# Patient Record
Sex: Female | Born: 1970
Health system: Southern US, Community
[De-identification: ages and names within clinical notes are randomized; demographics above are authoritative.]

## PROBLEM LIST (undated history)

## (undated) ENCOUNTER — Emergency Department (HOSPITAL_COMMUNITY): Payer: BLUE CROSS/BLUE SHIELD | Source: Home / Self Care

## (undated) DIAGNOSIS — J309 Allergic rhinitis, unspecified: Secondary | ICD-10-CM

## (undated) DIAGNOSIS — F329 Major depressive disorder, single episode, unspecified: Secondary | ICD-10-CM

## (undated) DIAGNOSIS — K219 Gastro-esophageal reflux disease without esophagitis: Secondary | ICD-10-CM

## (undated) DIAGNOSIS — F341 Dysthymic disorder: Secondary | ICD-10-CM

## (undated) DIAGNOSIS — F411 Generalized anxiety disorder: Secondary | ICD-10-CM

## (undated) DIAGNOSIS — R87619 Unspecified abnormal cytological findings in specimens from cervix uteri: Secondary | ICD-10-CM

## (undated) DIAGNOSIS — G43909 Migraine, unspecified, not intractable, without status migrainosus: Secondary | ICD-10-CM

## (undated) HISTORY — DX: Allergic rhinitis, unspecified: J30.9

## (undated) HISTORY — PX: NO PAST SURGERIES: SHX2092

## (undated) HISTORY — DX: Unspecified abnormal cytological findings in specimens from cervix uteri: R87.619

## (undated) HISTORY — DX: Dysthymic disorder: F34.1

## (undated) HISTORY — DX: Major depressive disorder, single episode, unspecified: F32.9

## (undated) HISTORY — DX: Migraine, unspecified, not intractable, without status migrainosus: G43.909

## (undated) HISTORY — PX: LEEP: SHX91

## (undated) HISTORY — PX: TUBAL LIGATION: SHX77

## (undated) HISTORY — DX: Generalized anxiety disorder: F41.1

---

## 1999-04-15 ENCOUNTER — Other Ambulatory Visit: Admission: RE | Admit: 1999-04-15 | Discharge: 1999-04-15 | Payer: Self-pay | Admitting: Obstetrics and Gynecology

## 2000-06-28 ENCOUNTER — Other Ambulatory Visit: Admission: RE | Admit: 2000-06-28 | Discharge: 2000-06-28 | Payer: Self-pay | Admitting: Obstetrics and Gynecology

## 2001-09-18 ENCOUNTER — Other Ambulatory Visit: Admission: RE | Admit: 2001-09-18 | Discharge: 2001-09-18 | Payer: Self-pay | Admitting: Obstetrics and Gynecology

## 2003-10-29 ENCOUNTER — Other Ambulatory Visit: Admission: RE | Admit: 2003-10-29 | Discharge: 2003-10-29 | Payer: Self-pay | Admitting: Obstetrics and Gynecology

## 2004-06-22 ENCOUNTER — Other Ambulatory Visit: Admission: RE | Admit: 2004-06-22 | Discharge: 2004-06-22 | Payer: Self-pay | Admitting: Obstetrics and Gynecology

## 2004-06-30 ENCOUNTER — Ambulatory Visit: Payer: Self-pay | Admitting: Licensed Clinical Social Worker

## 2004-07-05 ENCOUNTER — Ambulatory Visit: Payer: Self-pay | Admitting: Licensed Clinical Social Worker

## 2004-07-15 ENCOUNTER — Ambulatory Visit: Payer: Self-pay | Admitting: Licensed Clinical Social Worker

## 2004-07-27 ENCOUNTER — Ambulatory Visit: Payer: Self-pay | Admitting: Licensed Clinical Social Worker

## 2004-08-04 ENCOUNTER — Ambulatory Visit: Payer: Self-pay | Admitting: Licensed Clinical Social Worker

## 2004-08-10 ENCOUNTER — Ambulatory Visit: Payer: Self-pay | Admitting: Licensed Clinical Social Worker

## 2004-08-17 ENCOUNTER — Ambulatory Visit: Payer: Self-pay | Admitting: Licensed Clinical Social Worker

## 2004-08-26 ENCOUNTER — Ambulatory Visit: Payer: Self-pay | Admitting: Licensed Clinical Social Worker

## 2004-12-31 ENCOUNTER — Other Ambulatory Visit: Admission: RE | Admit: 2004-12-31 | Discharge: 2004-12-31 | Payer: Self-pay | Admitting: Obstetrics and Gynecology

## 2005-06-15 ENCOUNTER — Other Ambulatory Visit: Admission: RE | Admit: 2005-06-15 | Discharge: 2005-06-15 | Payer: Self-pay | Admitting: Obstetrics and Gynecology

## 2005-08-03 ENCOUNTER — Ambulatory Visit: Payer: Self-pay | Admitting: Internal Medicine

## 2005-11-28 ENCOUNTER — Other Ambulatory Visit: Admission: RE | Admit: 2005-11-28 | Discharge: 2005-11-28 | Payer: Self-pay | Admitting: Obstetrics and Gynecology

## 2006-10-13 ENCOUNTER — Ambulatory Visit: Payer: Self-pay | Admitting: Internal Medicine

## 2007-02-21 ENCOUNTER — Ambulatory Visit: Payer: Self-pay | Admitting: Internal Medicine

## 2007-10-25 ENCOUNTER — Encounter: Payer: Self-pay | Admitting: *Deleted

## 2007-10-25 DIAGNOSIS — G43909 Migraine, unspecified, not intractable, without status migrainosus: Secondary | ICD-10-CM | POA: Insufficient documentation

## 2007-10-25 DIAGNOSIS — J309 Allergic rhinitis, unspecified: Secondary | ICD-10-CM

## 2007-10-25 DIAGNOSIS — L509 Urticaria, unspecified: Secondary | ICD-10-CM | POA: Insufficient documentation

## 2007-10-25 DIAGNOSIS — E7800 Pure hypercholesterolemia, unspecified: Secondary | ICD-10-CM | POA: Insufficient documentation

## 2007-10-25 DIAGNOSIS — F341 Dysthymic disorder: Secondary | ICD-10-CM

## 2007-10-25 HISTORY — DX: Dysthymic disorder: F34.1

## 2007-10-25 HISTORY — DX: Migraine, unspecified, not intractable, without status migrainosus: G43.909

## 2007-10-25 HISTORY — DX: Allergic rhinitis, unspecified: J30.9

## 2008-03-26 LAB — CONVERTED CEMR LAB: Pap Smear: NORMAL

## 2009-02-26 ENCOUNTER — Ambulatory Visit: Payer: Self-pay | Admitting: Internal Medicine

## 2009-02-27 LAB — CONVERTED CEMR LAB
ALT: 10 units/L (ref 0–35)
AST: 15 units/L (ref 0–37)
Alkaline Phosphatase: 45 units/L (ref 39–117)
BUN: 11 mg/dL (ref 6–23)
Basophils Relative: 0.6 % (ref 0.0–3.0)
Bilirubin Urine: NEGATIVE
Bilirubin, Direct: 0.2 mg/dL (ref 0.0–0.3)
CO2: 28 meq/L (ref 19–32)
Calcium: 8.8 mg/dL (ref 8.4–10.5)
Eosinophils Relative: 2.4 % (ref 0.0–5.0)
Glucose, Bld: 87 mg/dL (ref 70–99)
HDL: 74.4 mg/dL (ref >39.00)
Ketones, ur: NEGATIVE mg/dL
Monocytes Relative: 7.3 % (ref 3.0–12.0)
Neutrophils Relative %: 50.2 % (ref 43.0–77.0)
Nitrite: NEGATIVE
Platelets: 143 10*3/uL — ABNORMAL LOW (ref 150.0–400.0)
Potassium: 4.5 meq/L (ref 3.5–5.1)
RBC: 4.2 M/uL (ref 3.87–5.11)
Sodium: 140 meq/L (ref 135–145)
Total Bilirubin: 1 mg/dL (ref 0.3–1.2)
Total CHOL/HDL Ratio: 3
Total Protein, Urine: NEGATIVE mg/dL
Urine Glucose: NEGATIVE mg/dL
VLDL: 5.2 mg/dL (ref 0.0–40.0)
WBC: 4 10*3/uL — ABNORMAL LOW (ref 4.5–10.5)
pH: 5.5 (ref 5.0–8.0)

## 2009-03-05 ENCOUNTER — Ambulatory Visit: Payer: Self-pay | Admitting: Internal Medicine

## 2009-10-05 ENCOUNTER — Ambulatory Visit: Payer: Self-pay | Admitting: Internal Medicine

## 2009-10-05 DIAGNOSIS — F32A Depression, unspecified: Secondary | ICD-10-CM | POA: Insufficient documentation

## 2009-10-05 DIAGNOSIS — F329 Major depressive disorder, single episode, unspecified: Secondary | ICD-10-CM

## 2009-10-05 DIAGNOSIS — F411 Generalized anxiety disorder: Secondary | ICD-10-CM

## 2009-10-05 DIAGNOSIS — F3289 Other specified depressive episodes: Secondary | ICD-10-CM

## 2009-10-05 HISTORY — DX: Generalized anxiety disorder: F41.1

## 2009-10-05 HISTORY — DX: Other specified depressive episodes: F32.89

## 2009-10-05 HISTORY — DX: Major depressive disorder, single episode, unspecified: F32.9

## 2009-10-21 ENCOUNTER — Telehealth: Payer: Self-pay | Admitting: Internal Medicine

## 2010-09-28 NOTE — Assessment & Plan Note (Signed)
Summary: ANXIETY/NWS   Vital Signs:  Patient profile:   40 year old female Height:      68 inches Weight:      138 pounds BMI:     21.06 O2 Sat:      98 % on Room air Temp:     97.1 degrees F oral Pulse rate:   63 / minute BP sitting:   110 / 70  (left arm) Cuff size:   regular  Vitals Entered ByZella Ball Ewing (October 05, 2009 2:19 PM)  O2 Flow:  Room air CC: anxiety/RE   CC:  anxiety/RE.  History of Present Illness: currently separated from father of her children, then more trouble more recently;;citalopram seemed to do quite well with another episode a few years ago;; no suicidal ideation;  aniety is severe and missed a couple of days, but wal also assoc with one month BCP htat she took for irreg period;  still single parent of 2 children;  job is stable but just not very happy with her job;  no suicidal ideation and panic attacks;  no smoatic symptoms it seems but very worrisome all the time and feels queezy; Pt denies CP, sob, doe, wheezing, orthopnea, pnd, worsening LE edema, palps, dizziness or syncope   Pt denies new neuro symptoms such as headache, facial or extremity weakness   Recent labs july 2010 essentially normal including tsh.  Problems Prior to Update: 1)  Depression  (ICD-311) 2)  Anxiety  (ICD-300.00) 3)  Preventive Health Care  (ICD-V70.0) 4)  Allergic Rhinitis  (ICD-477.9) 5)  Hx of Urticaria  (ICD-708.9) 6)  Hx of Anxiety Depression  (ICD-300.4) 7)  Migraine Headache  (ICD-346.90) 8)  Hypercholesterolemia  (ICD-272.0)  Medications Prior to Update: 1)  None  Current Medications (verified): 1)  Citalopram Hydrobromide 20 Mg Tabs (Citalopram Hydrobromide) .Marland Kitchen.. 1po Once Daily  Allergies (verified): 1)  ! Penicillin  Past History:  Past Surgical History: Last updated: 03/05/2009 Denies surgical history  Social History: Last updated: 10/05/2009 Occupation: insurance Married/separated 2 children  Risk Factors: Smoking Status: quit  (10/25/2007)  Past Medical History: ALLERGIC RHINITIS (ICD-477.9) Hx of URTICARIA (ICD-708.9) Hx of ANXIETY DEPRESSION (ICD-300.4) MIGRAINE HEADACHE (ICD-346.90) HYPERCHOLESTEROLEMIA (ICD-272.0) Anxiety Depression  Social History: Reviewed history from 03/05/2009 and no changes required. Occupation: insurance Married/separated 2 children  Review of Systems       all otherwise negative per pt -   Physical Exam  General:  alert and well-developed.   Head:  normocephalic and atraumatic.   Eyes:  vision grossly intact, pupils equal, and pupils round.   Ears:  R ear normal and L ear normal.   Nose:  no external deformity and no nasal discharge.   Mouth:  no gingival abnormalities and pharynx pink and moist.   Neck:  supple and no masses.   Lungs:  normal respiratory effort and normal breath sounds.   Heart:  normal rate and regular rhythm.   Extremities:  no edema, no erythema  Psych:  depressed affect and moderately anxious.     Impression & Recommendations:  Problem # 1:  ANXIETY (ICD-300.00)  Her updated medication list for this problem includes:    Citalopram Hydrobromide 20 Mg Tabs (Citalopram hydrobromide) .Marland Kitchen... 1po once daily treat as above, f/u any worsening signs or symptoms , declines counseling due to cost  Problem # 2:  DEPRESSION (ICD-311)  Her updated medication list for this problem includes:    Citalopram Hydrobromide 20 Mg Tabs (Citalopram hydrobromide) .Marland KitchenMarland KitchenMarland KitchenMarland Kitchen  1po once daily treat as above, f/u any worsening signs or symptoms   Complete Medication List: 1)  Citalopram Hydrobromide 20 Mg Tabs (Citalopram hydrobromide) .Marland Kitchen.. 1po once daily  Patient Instructions: 1)  Please take all new medications as prescribed - start the citalopram at HALF pill for 3 days, then whole pill after that 2)  Continue all previous medications as before this visit  3)  Please schedule a follow-up appointment in 6 months with CPX labs or sooner if  needed Prescriptions: CITALOPRAM HYDROBROMIDE 20 MG TABS (CITALOPRAM HYDROBROMIDE) 1po once daily  #90 x 3   Entered and Authorized by:   Corwin Levins MD   Signed by:   Corwin Levins MD on 10/05/2009   Method used:   Print then Give to Patient   RxID:   [id number]

## 2010-09-28 NOTE — Progress Notes (Signed)
Summary: Side Effect  Phone Note Call from Patient   Caller: Patient [phone] Summary of Call: pt called to report difficulty sleeping since starting Citalopram.  Initial call taken by: Margaret Pyle, CMA,  October 21, 2009 1:13 PM  Follow-up for Phone Call        hard to say if this is from the citalopram since this is not a common side effect;  much more common is a complaint of sleepiness;  would she want something for help with getting to sleep? Follow-up by: Corwin Levins MD,  October 21, 2009 1:25 PM  Additional Follow-up for Phone Call Additional follow up Details #1::        pt would like RX to help with sleep to [facility] on [address] Additional Follow-up by: Margaret Pyle, CMA,  October 21, 2009 1:37 PM    Additional Follow-up for Phone Call Additional follow up Details #2::    ok for zolpiden as needed -. done hardcopy to LIM side B - dahlia  Follow-up by: Corwin Levins MD,  October 21, 2009 2:50 PM  Additional Follow-up for Phone Call Additional follow up Details #3:: Details for Additional Follow-up Action Taken: RX faxed to pharmacy per pr request Additional Follow-up by: Margaret Pyle, CMA,  October 21, 2009 3:03 PM  New/Updated Medications: ZOLPIDEM TARTRATE 10 MG TABS (ZOLPIDEM TARTRATE) 1po at bedtime as needed Prescriptions: ZOLPIDEM TARTRATE 10 MG TABS (ZOLPIDEM TARTRATE) 1po at bedtime as needed  #30 x 3   Entered and Authorized by:   Corwin Levins MD   Signed by:   Corwin Levins MD on 10/21/2009   Method used:   Print then Give to Patient   RxID:   [id number]

## 2010-12-15 ENCOUNTER — Other Ambulatory Visit: Payer: Self-pay

## 2010-12-15 ENCOUNTER — Other Ambulatory Visit: Payer: Self-pay | Admitting: Internal Medicine

## 2010-12-15 DIAGNOSIS — Z0000 Encounter for general adult medical examination without abnormal findings: Secondary | ICD-10-CM

## 2010-12-17 ENCOUNTER — Other Ambulatory Visit (INDEPENDENT_AMBULATORY_CARE_PROVIDER_SITE_OTHER): Payer: BC Managed Care – PPO | Admitting: Internal Medicine

## 2010-12-17 ENCOUNTER — Other Ambulatory Visit (INDEPENDENT_AMBULATORY_CARE_PROVIDER_SITE_OTHER): Payer: BC Managed Care – PPO

## 2010-12-17 DIAGNOSIS — Z0000 Encounter for general adult medical examination without abnormal findings: Secondary | ICD-10-CM

## 2010-12-17 DIAGNOSIS — E785 Hyperlipidemia, unspecified: Secondary | ICD-10-CM

## 2010-12-17 LAB — BASIC METABOLIC PANEL
Calcium: 9 mg/dL (ref 8.4–10.5)
Chloride: 105 mEq/L (ref 96–112)
Creatinine, Ser: 0.8 mg/dL (ref 0.4–1.2)
Sodium: 140 mEq/L (ref 135–145)

## 2010-12-17 LAB — CBC WITH DIFFERENTIAL/PLATELET
Basophils Absolute: 0 10*3/uL (ref 0.0–0.1)
Eosinophils Absolute: 0.1 10*3/uL (ref 0.0–0.7)
Hemoglobin: 13.4 g/dL (ref 12.0–15.0)
Lymphocytes Relative: 32.1 % (ref 12.0–46.0)
MCHC: 34.6 g/dL (ref 30.0–36.0)
Neutro Abs: 3.4 10*3/uL (ref 1.4–7.7)
Neutrophils Relative %: 58.4 % (ref 43.0–77.0)
Platelets: 178 10*3/uL (ref 150.0–400.0)
RDW: 13.1 % (ref 11.5–14.6)

## 2010-12-17 LAB — URINALYSIS
Bilirubin Urine: NEGATIVE
Ketones, ur: NEGATIVE
Leukocytes, UA: NEGATIVE
Urobilinogen, UA: 0.2 (ref 0.0–1.0)

## 2010-12-17 LAB — LIPID PANEL
HDL: 74.3 mg/dL (ref >39.00)
Total CHOL/HDL Ratio: 3
Triglycerides: 26 mg/dL (ref 0.0–149.0)

## 2010-12-17 LAB — TSH: TSH: 1.28 u[IU]/mL (ref 0.35–5.50)

## 2010-12-17 LAB — HEPATIC FUNCTION PANEL
ALT: 14 U/L (ref 0–35)
Bilirubin, Direct: 0.1 mg/dL (ref 0.0–0.3)
Total Bilirubin: 1.1 mg/dL (ref 0.3–1.2)

## 2010-12-19 ENCOUNTER — Encounter: Payer: Self-pay | Admitting: Internal Medicine

## 2010-12-19 DIAGNOSIS — Z0000 Encounter for general adult medical examination without abnormal findings: Secondary | ICD-10-CM | POA: Insufficient documentation

## 2010-12-19 DIAGNOSIS — Z719 Counseling, unspecified: Secondary | ICD-10-CM | POA: Insufficient documentation

## 2010-12-22 ENCOUNTER — Ambulatory Visit (INDEPENDENT_AMBULATORY_CARE_PROVIDER_SITE_OTHER): Payer: BC Managed Care – PPO | Admitting: Internal Medicine

## 2010-12-22 ENCOUNTER — Encounter: Payer: Self-pay | Admitting: Internal Medicine

## 2010-12-22 ENCOUNTER — Ambulatory Visit (INDEPENDENT_AMBULATORY_CARE_PROVIDER_SITE_OTHER)
Admission: RE | Admit: 2010-12-22 | Discharge: 2010-12-22 | Disposition: A | Discharge status: Home / Self Care | Payer: BC Managed Care – PPO | Source: Ambulatory Visit | Attending: Internal Medicine | Admitting: Internal Medicine

## 2010-12-22 VITALS — BP 106/70 | HR 67 | Temp 98.4°F | Ht 68.0 in | Wt 144.1 lb

## 2010-12-22 DIAGNOSIS — F411 Generalized anxiety disorder: Secondary | ICD-10-CM

## 2010-12-22 DIAGNOSIS — R0609 Other forms of dyspnea: Secondary | ICD-10-CM

## 2010-12-22 DIAGNOSIS — Z0000 Encounter for general adult medical examination without abnormal findings: Secondary | ICD-10-CM

## 2010-12-22 DIAGNOSIS — M25519 Pain in unspecified shoulder: Secondary | ICD-10-CM

## 2010-12-22 DIAGNOSIS — M25511 Pain in right shoulder: Secondary | ICD-10-CM

## 2010-12-22 DIAGNOSIS — E7800 Pure hypercholesterolemia, unspecified: Secondary | ICD-10-CM

## 2010-12-22 MED ORDER — CITALOPRAM HYDROBROMIDE 20 MG PO TABS: 20.0000 mg | ORAL_TABLET | Freq: Every day | ORAL | Status: DC

## 2010-12-22 MED ORDER — ZOLPIDEM TARTRATE 10 MG PO TABS: 10.0000 mg | ORAL_TABLET | Freq: Every evening | ORAL | Status: DC | PRN

## 2010-12-22 NOTE — Assessment & Plan Note (Signed)
Mild , for diet control - declines dietary referral at this time

## 2010-12-22 NOTE — Assessment & Plan Note (Signed)
?   Etiology,  Not likely deconditioning, may have mild psych overlay?  Will need further evalutiaon with cxr and echo

## 2010-12-22 NOTE — Progress Notes (Signed)
Subjective:    Patient ID: [name], female    DOB: [birth year], 40 y.o.   MRN: [id number]  HPI Here for wellness and f/u;  Overall doing ok;  Pt denies CP, worsening SOB, DOE, wheezing, orthopnea, PND, worsening LE edema,  dizziness or syncope, although has had occasional few seconds palpitations intermittent since 40yo.Marland Kitchen  Pt denies neurological change such as new Headache, facial or extremity weakness.  Pt denies polydipsia, polyuria, or low sugar symptoms. Pt states overall good compliance with treatment and medications, good tolerability, and trying to follow lower cholesterol diet.  Pt denies worsening depressive symptoms, suicidal ideation or panic. No fever, wt loss, night sweats, loss of appetite, or other constitutional symptoms.  Pt states good ability with ADL's, low fall risk, home safety reviewed and adequate, no significant changes in hearing or vision, and occasionally active with exercise.   Has been active running in the past on the treadmill, up to 2-3 miles and would have right shoudler pain at end exercise, but not with ellipitcal or bike, so mostly doing bike in the past few months, also gets red in the face with running, but o/w not undue sob/doe.   Past Medical History  Diagnosis Date  . HYPERCHOLESTEROLEMIA 10/25/2007  . ANXIETY 10/05/2009  . ANXIETY DEPRESSION 10/25/2007  . DEPRESSION 10/05/2009  . MIGRAINE HEADACHE 10/25/2007  . ALLERGIC RHINITIS 10/25/2007  . URTICARIA 10/25/2007   History reviewed. No pertinent past surgical history.  reports that she has never smoked. She does not have any smokeless tobacco history on file. She reports that she drinks alcohol. She reports that she does not use illicit drugs. family history includes Hypertension in her father and unspecified family member. Allergies  Allergen Reactions  . Penicillins    Current Outpatient Prescriptions on File Prior to Visit  Medication Sig Dispense Refill  . citalopram (CELEXA) 20 MG tablet Take 20 mg  by mouth daily.        Marland Kitchen zolpidem (AMBIEN) 10 MG tablet Take 10 mg by mouth at bedtime as needed.         Review of Systems Review of Systems  Constitutional: Negative for diaphoresis, activity change, appetite change and unexpected weight change.  HENT: Negative for hearing loss, ear pain, facial swelling, mouth sores and neck stiffness.   Eyes: Negative for pain, redness and visual disturbance.  Respiratory: Negative for shortness of breath and wheezing.   Cardiovascular: Negative for chest pain and palpitations.  Gastrointestinal: Negative for diarrhea, blood in stool, abdominal distention and rectal pain.  Genitourinary: Negative for hematuria, flank pain and decreased urine volume.  Musculoskeletal: Negative for myalgias and joint swelling.  Skin: Negative for color change and wound.  Neurological: Negative for syncope and numbness.  Hematological: Negative for adenopathy.  Psychiatric/Behavioral: Negative for hallucinations, self-injury, decreased concentration and agitation.      Objective:   Physical Exam BP 106/70  Pulse 67  Temp(Src) 98.4 F (36.9 C) (Oral)  Ht 5\' 8"  (1.727 m)  Wt 144 lb 2 oz (65.375 kg)  BMI 21.91 kg/m2  SpO2 99% Physical Exam  VS noted Constitutional: Pt is oriented to person, place, and time. Appears well-developed and well-nourished.  HENT:  Head: Normocephalic and atraumatic.  Right Ear: External ear normal.  Left Ear: External ear normal.  Nose: Nose normal.  Mouth/Throat: Oropharynx is clear and moist.  Eyes: Conjunctivae and EOM are normal. Pupils are equal, round, and reactive to light.  Neck: Normal range of motion. Neck supple. No JVD  present. No tracheal deviation present.  Cardiovascular: Normal rate, regular rhythm, normal heart sounds and intact distal pulses.   Pulmonary/Chest: Effort normal and breath sounds normal.  Abdominal: Soft. Bowel sounds are normal. There is no tenderness.  Musculoskeletal: Normal range of motion. Exhibits  no edema.  Lymphadenopathy:  Has no cervical adenopathy.  Neurological: Pt is alert and oriented to person, place, and time. Pt has normal reflexes. No cranial nerve deficit.  Skin: Skin is warm and dry. No rash noted.  Psychiatric:  Has  normal mood and affect. Behavior is normal.         Assessment & Plan:

## 2010-12-22 NOTE — Patient Instructions (Addendum)
Continue all other medications as before (the citalopram was sent to the pharmacy) You could consider decreasing the citalopram to Half (10 mg) for one month, and remain at that dose, or stop after one month to see if you can do without the medication Please go to XRAY in the Basement for the x-ray test You will be contacted regarding the referral for: Echocardiogram Please call the number on the Blue Card (the [employer]Tree System) for results of testing in 2-3 days If results are normal, we can follow for any worsening symptoms, although I could refer to cardiology if you like Please return in 1 year for your yearly visit, or sooner if needed, with Lab testing done 3-5 days before

## 2010-12-22 NOTE — Assessment & Plan Note (Signed)
stable overall by hx and exam, most recent lab reviewed with pt, and pt to continue medical treatment as before 

## 2010-12-22 NOTE — Assessment & Plan Note (Signed)
Exam benign,  to f/u any worsening symptoms or concerns, suspect MSK strain with exercise/tendonitis

## 2010-12-22 NOTE — Assessment & Plan Note (Addendum)

## 2010-12-22 NOTE — Progress Notes (Signed)
Quick Note:  Voice message left on PhoneTree system - lab is negative, normal or otherwise stable, pt to continue same tx ______ 

## 2010-12-30 ENCOUNTER — Other Ambulatory Visit (HOSPITAL_COMMUNITY): Payer: BC Managed Care – PPO | Admitting: Radiology

## 2011-01-05 ENCOUNTER — Ambulatory Visit (HOSPITAL_COMMUNITY): Payer: BC Managed Care – PPO | Attending: Internal Medicine | Admitting: Radiology

## 2011-01-05 DIAGNOSIS — R0602 Shortness of breath: Secondary | ICD-10-CM

## 2011-01-05 DIAGNOSIS — R0989 Other specified symptoms and signs involving the circulatory and respiratory systems: Secondary | ICD-10-CM | POA: Insufficient documentation

## 2011-01-05 DIAGNOSIS — R0609 Other forms of dyspnea: Secondary | ICD-10-CM | POA: Insufficient documentation

## 2011-07-10 MED ORDER — ONDANSETRON HCL 4 MG/2ML IJ SOLN
INTRAMUSCULAR | Status: AC
  Filled 2011-07-10: qty 2

## 2011-07-10 MED ORDER — EPHEDRINE 5 MG/ML INJ
INTRAVENOUS | Status: AC
  Filled 2011-07-10: qty 10

## 2011-07-10 MED ORDER — MEPERIDINE HCL 25 MG/ML IJ SOLN
INTRAMUSCULAR | Status: AC
  Filled 2011-07-10: qty 1

## 2011-07-10 MED ORDER — CEFAZOLIN SODIUM 1-5 GM-% IV SOLN
INTRAVENOUS | Status: AC
  Filled 2011-07-10: qty 50

## 2011-07-10 MED ORDER — OXYTOCIN 10 UNIT/ML IJ SOLN
INTRAMUSCULAR | Status: AC
  Filled 2011-07-10: qty 4

## 2011-07-10 MED ORDER — LIDOCAINE-EPINEPHRINE (PF) 2 %-1:200000 IJ SOLN
INTRAMUSCULAR | Status: AC
  Filled 2011-07-10: qty 20

## 2011-07-10 MED ORDER — MORPHINE SULFATE 0.5 MG/ML IJ SOLN
INTRAMUSCULAR | Status: AC
  Filled 2011-07-10: qty 10

## 2011-07-10 MED ORDER — SODIUM BICARBONATE 8.4 % IV SOLN
INTRAVENOUS | Status: AC
  Filled 2011-07-10: qty 50

## 2011-07-10 MED ORDER — PHENYLEPHRINE 40 MCG/ML (10ML) SYRINGE FOR IV PUSH (FOR BLOOD PRESSURE SUPPORT)
PREFILLED_SYRINGE | INTRAVENOUS | Status: AC
  Filled 2011-07-10: qty 5

## 2011-07-18 ENCOUNTER — Encounter (HOSPITAL_COMMUNITY): Payer: Self-pay

## 2011-07-18 NOTE — Patient Instructions (Addendum)
   Your procedure is scheduled on: Friday, Nov 30th  Enter through the Main Entrance of [facility] at  12:00 noon Pick up the phone at the desk and dial [phone] and inform us of your arrival.  Please call this number if you have any problems the morning of surgery: [phone]  Remember: Do not eat food after midnight: Thursday Do not drink clear liquids after: 9:30 Friday Take these medicines the morning of surgery with a SIP OF WATER:celexa  Do not wear jewelry, make-up, or FINGER nail polish Do not wear lotions, powders, or perfumes.  You may not  wear deodorant. Do not shave 48 hours prior to surgery. Do not bring valuables to the hospital.  Patients discharged on the day of surgery will not be allowed to drive home.    Remember to use your hibiclens as instructed.Please shower with 1/2 bottle the evening before your surgery and the other 1/2 bottle the morning of surgery.

## 2011-07-22 ENCOUNTER — Encounter (HOSPITAL_COMMUNITY): Payer: Self-pay

## 2011-07-22 ENCOUNTER — Encounter (HOSPITAL_COMMUNITY)
Admission: RE | Admit: 2011-07-22 | Discharge: 2011-07-22 | Disposition: A | Discharge status: Home / Self Care | Payer: BC Managed Care – PPO | Source: Ambulatory Visit | Attending: Obstetrics and Gynecology | Admitting: Obstetrics and Gynecology

## 2011-07-22 HISTORY — DX: Gastro-esophageal reflux disease without esophagitis: K21.9

## 2011-07-22 LAB — CBC
MCH: 30.5 pg (ref 26.0–34.0)
Platelets: 179 10*3/uL (ref 150–400)
RBC: 4.23 MIL/uL (ref 3.87–5.11)
WBC: 5.1 10*3/uL (ref 4.0–10.5)

## 2011-07-22 LAB — SURGICAL PCR SCREEN: MRSA, PCR: NEGATIVE

## 2011-07-23 NOTE — H&P (Signed)
  Patient name  [name] DICTATION# [id number] CSN# [id number]  Juluis Mire, MD 07/23/2011 10:19 AM

## 2011-07-25 NOTE — H&P (Signed)
[name], SWEETING NO.:  [id number]  MEDICAL RECORD NO.:  [id number]  LOCATION:                                 FACILITY:  PHYSICIAN:  Juluis Mire, M.D.   DATE OF BIRTH:  05-Nov-1970  DATE OF ADMISSION: DATE OF DISCHARGE:                             HISTORY & PHYSICAL   Date of her surgery is July 29, 2011, at [facility].  The patient is a 40 year old, gravida 2, para 2 female, presents for removal of an IUD, hysteroscopy with D and C, and subsequent NovaSure ablation, laparoscopic bilateral tubal ligation.  In relation to present admission, the patient has been followed with significant menorrhagia.  She will have 4-5 days of flow, changing pads and tampons every 1-2 hours with clots.  She has undergone previous saline infusion ultrasounds that revealed evidence of adenomyosis.  We have tried treating this with a Mirena IUD; however, she has continued to experience abnormal bleeding and wished to proceed with more aggressive therapy.  Alternatives have been discussed including trying other hormonal measures, such as birth control pills versus Depo- Provera.  We have also discussed endometrial ablation versus hysterectomy.  The patient now presents for endometrial ablation.  Does wish to have her tubes tied, potential irreversibility of sterilization is discussed.  The failure rate of 1 in 200 was quoted.  Failures can be in the form of ectopic pregnancy requiring further surgical management. The patient expressed understanding of the alternatives and the above information.  In terms of allergies, she is allergic to PENICILLIN.  MEDICATIONS:  Celexa 10 mg daily.  PAST MEDICAL HISTORY:  Usual childhood diseases, no significant sequelae.  She has had 2 vaginal deliveries and no surgical history.  FAMILY HISTORY:  There is a history of lung cancer as well as diabetes.  SOCIAL HISTORY:  No tobacco and only minimal alcohol use.  REVIEW OF  SYSTEMS:  Noncontributory.  PHYSICAL EXAMINATION:  VITAL SIGNS:  The patient is afebrile.  Stable vital signs. HEENT:  The patient is normocephalic.  Pupils equal, round, and reactive to light and accommodation.  Extraocular movements were intact.  Sclerae and conjunctivae clear.  Oropharynx clear. NECK:  Without thyromegaly. BREASTS:  No discrete masses. LUNGS:  Clear. CARDIOVASCULAR:  Regular rhythm and rate without murmurs or gallops. ABDOMEN:  Benign.  No mass, organomegaly, or tenderness. PELVIC:  Normal external genitalia.  Vaginal mucosa is clear.  Cervix unremarkable.  Uterus normal size, shape, and contour.  Adnexa free of masses or tenderness. EXTREMITIES:  Trace edema. NEUROLOGIC:  Grossly within normal limits.  IMPRESSION: 1. Menorrhagia, secondary to adenomyosis, unresponsive to conservative     management. 2. Multiparity, desires sterility.  PLAN:  The patient will undergo removal of IUD.  We will then proceed with hysteroscopy, D and C, and NovaSure ablation.  Subsequently, we will do a laparoscopic bilateral tubal ligation.  Risks of surgery have been discussed including the risk of infection.  The risk of hemorrhage that could require transfusion with the risk of AIDS or hepatitis. Excessive bleeding could require hysterectomy.  There is a risk of injury to adjacent organs including bladder, bowel, ureters that could require  further exploratory surgery.  Risk of deep venous thrombosis and pulmonary emboli.  The patient expressed understanding of potential risks, complications, and alternatives.  We discussed success rates for ablation of about 80%, 40% amenorrheic rates and again, we discussed failure rates associated with tubal.  The patient expressed understanding of the indications, risks, and alternatives.     Juluis Mire, M.D.     JSM/MEDQ  D:  07/23/2011  T:  07/23/2011  Job:  [id number]

## 2011-07-29 ENCOUNTER — Ambulatory Visit (HOSPITAL_COMMUNITY): Payer: BC Managed Care – PPO

## 2011-07-29 ENCOUNTER — Ambulatory Visit (HOSPITAL_COMMUNITY)
Admission: RE | Admit: 2011-07-29 | Discharge: 2011-07-29 | Disposition: A | Discharge status: Home / Self Care | Payer: BC Managed Care – PPO | Source: Ambulatory Visit | Attending: Obstetrics and Gynecology | Admitting: Obstetrics and Gynecology

## 2011-07-29 ENCOUNTER — Encounter (HOSPITAL_COMMUNITY): Payer: Self-pay

## 2011-07-29 ENCOUNTER — Other Ambulatory Visit: Payer: Self-pay | Admitting: Obstetrics and Gynecology

## 2011-07-29 ENCOUNTER — Encounter (HOSPITAL_COMMUNITY)
Admission: RE | Disposition: A | Discharge status: Home / Self Care | Payer: Self-pay | Source: Ambulatory Visit | Attending: Obstetrics and Gynecology

## 2011-07-29 DIAGNOSIS — N920 Excessive and frequent menstruation with regular cycle: Secondary | ICD-10-CM | POA: Insufficient documentation

## 2011-07-29 DIAGNOSIS — N979 Female infertility, unspecified: Secondary | ICD-10-CM

## 2011-07-29 DIAGNOSIS — F3289 Other specified depressive episodes: Secondary | ICD-10-CM

## 2011-07-29 DIAGNOSIS — L509 Urticaria, unspecified: Secondary | ICD-10-CM

## 2011-07-29 DIAGNOSIS — Z302 Encounter for sterilization: Secondary | ICD-10-CM | POA: Insufficient documentation

## 2011-07-29 DIAGNOSIS — R0609 Other forms of dyspnea: Secondary | ICD-10-CM

## 2011-07-29 DIAGNOSIS — N8000 Endometriosis of the uterus, unspecified: Secondary | ICD-10-CM | POA: Insufficient documentation

## 2011-07-29 DIAGNOSIS — M25511 Pain in right shoulder: Secondary | ICD-10-CM

## 2011-07-29 DIAGNOSIS — F411 Generalized anxiety disorder: Secondary | ICD-10-CM

## 2011-07-29 DIAGNOSIS — D252 Subserosal leiomyoma of uterus: Secondary | ICD-10-CM | POA: Insufficient documentation

## 2011-07-29 DIAGNOSIS — Z0000 Encounter for general adult medical examination without abnormal findings: Secondary | ICD-10-CM

## 2011-07-29 DIAGNOSIS — F329 Major depressive disorder, single episode, unspecified: Secondary | ICD-10-CM

## 2011-07-29 DIAGNOSIS — G43909 Migraine, unspecified, not intractable, without status migrainosus: Secondary | ICD-10-CM

## 2011-07-29 DIAGNOSIS — E7800 Pure hypercholesterolemia, unspecified: Secondary | ICD-10-CM

## 2011-07-29 DIAGNOSIS — Z01812 Encounter for preprocedural laboratory examination: Secondary | ICD-10-CM | POA: Insufficient documentation

## 2011-07-29 DIAGNOSIS — J309 Allergic rhinitis, unspecified: Secondary | ICD-10-CM

## 2011-07-29 DIAGNOSIS — Z01818 Encounter for other preprocedural examination: Secondary | ICD-10-CM | POA: Insufficient documentation

## 2011-07-29 HISTORY — PX: LAPAROSCOPIC TUBAL LIGATION: SHX1937

## 2011-07-29 HISTORY — PX: IUD REMOVAL: SHX5392

## 2011-07-29 LAB — HCG, SERUM, QUALITATIVE: Preg, Serum: NEGATIVE

## 2011-07-29 SURGERY — LIGATION, FALLOPIAN TUBE, LAPAROSCOPIC
Anesthesia: General | Site: Uterus | Wound class: Clean Contaminated

## 2011-07-29 MED ORDER — LACTATED RINGERS IV SOLN
INTRAVENOUS | Status: DC
  Administered 2011-07-29 (×2): via INTRAVENOUS
  Administered 2011-07-29: 500 mL/h via INTRAVENOUS

## 2011-07-29 MED ORDER — FENTANYL CITRATE 0.05 MG/ML IJ SOLN
INTRAMUSCULAR | Status: DC | PRN
  Administered 2011-07-29 (×3): 50 ug via INTRAVENOUS
  Administered 2011-07-29: 100 ug via INTRAVENOUS

## 2011-07-29 MED ORDER — KETOROLAC TROMETHAMINE 30 MG/ML IJ SOLN
INTRAMUSCULAR | Status: DC | PRN
  Administered 2011-07-29: 30 mg via INTRAVENOUS

## 2011-07-29 MED ORDER — DEXAMETHASONE SODIUM PHOSPHATE 10 MG/ML IJ SOLN
INTRAMUSCULAR | Status: AC
  Filled 2011-07-29: qty 1

## 2011-07-29 MED ORDER — LIDOCAINE HCL (CARDIAC) 20 MG/ML IV SOLN
INTRAVENOUS | Status: DC | PRN
  Administered 2011-07-29: 20 mg via INTRAVENOUS

## 2011-07-29 MED ORDER — MIDAZOLAM HCL 5 MG/5ML IJ SOLN
INTRAMUSCULAR | Status: DC | PRN
  Administered 2011-07-29: 2 mg via INTRAVENOUS

## 2011-07-29 MED ORDER — LIDOCAINE-EPINEPHRINE (PF) 1 %-1:200000 IJ SOLN
INTRAMUSCULAR | Status: DC | PRN
  Administered 2011-07-29: 20 mL

## 2011-07-29 MED ORDER — KETOROLAC TROMETHAMINE 30 MG/ML IJ SOLN
INTRAMUSCULAR | Status: AC
  Administered 2011-07-29: 30 mg via INTRAMUSCULAR
  Filled 2011-07-29: qty 1

## 2011-07-29 MED ORDER — NEOSTIGMINE METHYLSULFATE 1 MG/ML IJ SOLN
INTRAMUSCULAR | Status: DC | PRN
  Administered 2011-07-29: 5 mg via INTRAVENOUS

## 2011-07-29 MED ORDER — FENTANYL CITRATE 0.05 MG/ML IJ SOLN: 25.0000 ug | INTRAMUSCULAR | Status: DC | PRN

## 2011-07-29 MED ORDER — ONDANSETRON HCL 4 MG/2ML IJ SOLN
INTRAMUSCULAR | Status: DC | PRN
  Administered 2011-07-29: 4 mg via INTRAVENOUS

## 2011-07-29 MED ORDER — DEXAMETHASONE SODIUM PHOSPHATE 4 MG/ML IJ SOLN
INTRAMUSCULAR | Status: DC | PRN
  Administered 2011-07-29: 10 mg via INTRAVENOUS

## 2011-07-29 MED ORDER — PROPOFOL 10 MG/ML IV EMUL
INTRAVENOUS | Status: DC | PRN
  Administered 2011-07-29: 200 mg via INTRAVENOUS

## 2011-07-29 MED ORDER — KETOROLAC TROMETHAMINE 30 MG/ML IJ SOLN
30.0000 mg | Freq: Once | INTRAMUSCULAR | Status: AC
  Administered 2011-07-29: 30 mg via INTRAMUSCULAR

## 2011-07-29 MED ORDER — PROPOFOL 10 MG/ML IV EMUL
INTRAVENOUS | Status: AC
  Filled 2011-07-29: qty 20

## 2011-07-29 MED ORDER — BUPIVACAINE HCL (PF) 0.25 % IJ SOLN
INTRAMUSCULAR | Status: DC | PRN
  Administered 2011-07-29: 4 mL

## 2011-07-29 MED ORDER — MIDAZOLAM HCL 2 MG/2ML IJ SOLN
INTRAMUSCULAR | Status: AC
  Filled 2011-07-29: qty 2

## 2011-07-29 MED ORDER — NEOSTIGMINE METHYLSULFATE 1 MG/ML IJ SOLN
INTRAMUSCULAR | Status: AC
  Filled 2011-07-29: qty 10

## 2011-07-29 MED ORDER — KETOROLAC TROMETHAMINE 30 MG/ML IJ SOLN
INTRAMUSCULAR | Status: AC
  Filled 2011-07-29: qty 1

## 2011-07-29 MED ORDER — LIDOCAINE HCL (CARDIAC) 20 MG/ML IV SOLN
INTRAVENOUS | Status: AC
  Filled 2011-07-29: qty 5

## 2011-07-29 MED ORDER — ONDANSETRON HCL 4 MG/2ML IJ SOLN
INTRAMUSCULAR | Status: AC
  Filled 2011-07-29: qty 2

## 2011-07-29 MED ORDER — GLYCOPYRROLATE 0.2 MG/ML IJ SOLN
INTRAMUSCULAR | Status: AC
  Filled 2011-07-29: qty 3

## 2011-07-29 MED ORDER — LACTATED RINGERS IV SOLN
INTRAVENOUS | Status: DC | PRN
  Administered 2011-07-29: 3000 mL via INTRAUTERINE

## 2011-07-29 MED ORDER — GLYCOPYRROLATE 0.2 MG/ML IJ SOLN
INTRAMUSCULAR | Status: DC | PRN
  Administered 2011-07-29: 1 mg via INTRAVENOUS
  Administered 2011-07-29 (×2): 0.2 mg via INTRAVENOUS

## 2011-07-29 MED ORDER — ROCURONIUM BROMIDE 100 MG/10ML IV SOLN
INTRAVENOUS | Status: DC | PRN
  Administered 2011-07-29: 40 mg via INTRAVENOUS

## 2011-07-29 MED ORDER — FENTANYL CITRATE 0.05 MG/ML IJ SOLN
INTRAMUSCULAR | Status: AC
  Filled 2011-07-29: qty 5

## 2011-07-29 MED ORDER — OXYCODONE-ACETAMINOPHEN 5-500 MG PO CAPS: 1.0000 | ORAL_CAPSULE | ORAL | Status: AC | PRN

## 2011-07-29 MED ORDER — KETOROLAC TROMETHAMINE 30 MG/ML IM SOLN: 30.0000 mg | Freq: Once | INTRAMUSCULAR | Status: DC

## 2011-07-29 MED ORDER — ROCURONIUM BROMIDE 50 MG/5ML IV SOLN
INTRAVENOUS | Status: AC
  Filled 2011-07-29: qty 1

## 2011-07-29 SURGICAL SUPPLY — 20 items
ABLATOR ENDOMETRIAL BIPOLAR (ABLATOR) ×3 IMPLANT
CATH ROBINSON RED A/P 16FR (CATHETERS) ×3 IMPLANT
CLOTH BEACON ORANGE TIMEOUT ST (SAFETY) ×3 IMPLANT
CONTAINER PREFILL 10% NBF 60ML (FORM) ×3 IMPLANT
DRSG COVADERM PLUS 2X2 (GAUZE/BANDAGES/DRESSINGS) ×3 IMPLANT
GLOVE BIO SURGEON STRL SZ7 (GLOVE) ×9 IMPLANT
GOWN PREVENTION PLUS LG XLONG (DISPOSABLE) ×6 IMPLANT
NEEDLE INSUFFLATION 14GA 120MM (NEEDLE) ×3 IMPLANT
NEEDLE SPNL 18GX3.5 QUINCKE PK (NEEDLE) ×3 IMPLANT
PACK HYSTEROSCOPY LF (CUSTOM PROCEDURE TRAY) ×3 IMPLANT
PACK LAPAROSCOPY BASIN (CUSTOM PROCEDURE TRAY) ×3 IMPLANT
STRIP CLOSURE SKIN 1/4X3 (GAUZE/BANDAGES/DRESSINGS) IMPLANT
SUT VIC AB 3-0 FS2 27 (SUTURE) ×3 IMPLANT
SUT VICRYL 0 UR6 27IN ABS (SUTURE) IMPLANT
SYR CONTROL 10ML LL (SYRINGE) ×3 IMPLANT
TOWEL OR 17X24 6PK STRL BLUE (TOWEL DISPOSABLE) ×6 IMPLANT
TROCAR BALLN 12MMX100 BLUNT (TROCAR) IMPLANT
TROCAR Z-THREAD BLADED 11X100M (TROCAR) ×3 IMPLANT
WARMER LAPAROSCOPE (MISCELLANEOUS) ×3 IMPLANT
WATER STERILE IRR 1000ML POUR (IV SOLUTION) ×3 IMPLANT

## 2011-07-29 NOTE — Progress Notes (Signed)
Patient ID: [name], female   DOB: 01/08/1971, 40 y.o.   MRN: [id number] Patient name  [name] DICTATION# [id number] CSN# [id number]  Juluis Mire, MD 07/29/2011 2:15 PM

## 2011-07-29 NOTE — Transfer of Care (Signed)
Immediate Anesthesia Transfer of Care Note  Patient: [name]  Procedure(s) Performed:  LAPAROSCOPIC TUBAL LIGATION; INTRAUTERINE DEVICE (IUD) REMOVAL; DILATATION & CURETTAGE/HYSTEROSCOPY WITH NOVASURE ABLATION  Patient Location: PACU  Anesthesia Type: General  Level of Consciousness: awake, alert  and oriented  Airway & Oxygen Therapy: Patient Spontanous Breathing and Patient connected to nasal cannula oxygen  Post-op Assessment: Report given to PACU RN and Post -op Vital signs reviewed and stable  Post vital signs: Reviewed and stable  Complications: No apparent anesthesia complications

## 2011-07-29 NOTE — H&P (Signed)
  History and physical exam unchanged 

## 2011-07-29 NOTE — Anesthesia Procedure Notes (Signed)
Procedure Name: Intubation Date/Time: 07/29/2011 1:30 PM Performed by: Lincoln Brigham Pre-anesthesia Checklist: Patient identified, Timeout performed, Emergency Drugs available, Suction available and Patient being monitored Patient Re-evaluated:Patient Re-evaluated prior to inductionOxygen Delivery Method: Circle System Utilized Preoxygenation: Pre-oxygenation with 100% oxygen Intubation Type: IV induction Ventilation: Mask ventilation without difficulty Laryngoscope Size: Miller and 2 Grade View: Grade I Tube type: Oral Airway Equipment and Method: stylet Placement Confirmation: ETT inserted through vocal cords under direct vision,  positive ETCO2 and breath sounds checked- equal and bilateral Secured at: 21 cm Tube secured with: Tape Dental Injury: Teeth and Oropharynx as per pre-operative assessment

## 2011-07-29 NOTE — Anesthesia Preprocedure Evaluation (Signed)
Anesthesia Evaluation  Patient identified by MRN, date of birth, ID band Patient awake    Reviewed: Allergy & Precautions, H&P , Patient's Chart, lab work & pertinent test results, reviewed documented beta blocker date and time   History of Anesthesia Complications Negative for: history of anesthetic complications  Airway Mallampati: II TM Distance: >3 FB Neck ROM: full    Dental No notable dental hx.    Pulmonary neg pulmonary ROS, shortness of breath,  clear to auscultation  Pulmonary exam normal       Cardiovascular Exercise Tolerance: Good neg cardio ROS regular Normal    Neuro/Psych  Headaches, PSYCHIATRIC DISORDERS Negative Neurological ROS  Negative Psych ROS   GI/Hepatic negative GI ROS, Neg liver ROS, GERD-  Controlled,  Endo/Other  Negative Endocrine ROS  Renal/GU negative Renal ROS     Musculoskeletal   Abdominal   Peds  Hematology negative hematology ROS (+)   Anesthesia Other Findings   Reproductive/Obstetrics negative OB ROS                           Anesthesia Physical Anesthesia Plan  ASA: II  Anesthesia Plan: General   Post-op Pain Management:    Induction:   Airway Management Planned:   Additional Equipment:   Intra-op Plan:   Post-operative Plan:   Informed Consent: I have reviewed the patients History and Physical, chart, labs and discussed the procedure including the risks, benefits and alternatives for the proposed anesthesia with the patient or authorized representative who has indicated his/her understanding and acceptance.   Dental Advisory Given  Plan Discussed with: CRNA and Surgeon  Anesthesia Plan Comments:         Anesthesia Quick Evaluation

## 2011-07-29 NOTE — Anesthesia Postprocedure Evaluation (Signed)
Anesthesia Post Note  Patient: [name]  Procedure(s) Performed:  LAPAROSCOPIC TUBAL LIGATION; INTRAUTERINE DEVICE (IUD) REMOVAL; DILATATION & CURETTAGE/HYSTEROSCOPY WITH NOVASURE ABLATION  Anesthesia type: GA  Patient location: PACU  Post pain: Pain level controlled  Post assessment: Post-op Vital signs reviewed  Last Vitals:  Filed Vitals:   07/29/11 1430  BP: 120/67  Pulse: 104  Temp: 36.7 C  Resp: 16    Post vital signs: Reviewed  Level of consciousness: sedated  Complications: No apparent anesthesia complications

## 2011-07-29 NOTE — Brief Op Note (Signed)
07/29/2011  2:08 PM  PATIENT:  [name]  40 y.o. female  PRE-OPERATIVE DIAGNOSIS:  menorrhagia, desires sterilization  POST-OPERATIVE DIAGNOSIS:  menorrhagia, desires sterilization  PROCEDURE:  Procedure(s): LAPAROSCOPIC TUBAL LIGATION INTRAUTERINE DEVICE (IUD) REMOVAL DILATATION & CURETTAGE/HYSTEROSCOPY WITH NOVASURE ABLATION  SURGEON:  Surgeon(s): Paul Torpey S Jolee Critcher  PHYSICIAN ASSISTANT:   ASSISTANTS: none   ANESTHESIA:   general  EBL:  Total I/O In: 1000 [I.V.:1000] Out: -   BLOOD ADMINISTERED:none  DRAINS: none   LOCAL MEDICATIONS USED:  XYLOCAINE 20 CC  Marcaine 6 cc  SPECIMEN:  Source of Specimen:  endometrial curretting  DISPOSITION OF SPECIMEN:  PATHOLOGY  COUNTS:  YES  TOURNIQUET:  * No tourniquets in log *  DICTATION: .Other Dictation: Dictation Number [id number]  PLAN OF CARE: Discharge to home after PACU  PATIENT DISPOSITION:  PACU - hemodynamically stable.   Delay start of Pharmacological VTE agent (>24hrs) due to surgical blood loss or risk of bleeding:  {YES/NO/NOT APPLICABLE:20182

## 2011-07-30 NOTE — Op Note (Signed)
NAMEJERRIYAH, [name] NO.:  [id number]  MEDICAL RECORD NO.:  [id number]  LOCATION:  [facility]                          FACILITY:  [facility]  PHYSICIAN:  Juluis Mire, M.D.   DATE OF BIRTH:  [birth year]  DATE OF PROCEDURE:  07/29/2011 DATE OF DISCHARGE:  07/29/2011                              OPERATIVE REPORT   PREOPERATIVE DIAGNOSIS:  Menometrorrhagia.  Desires sterility.  POSTOPERATIVE DIAGNOSIS:  Menometrorrhagia.  Desires sterility with addition of a subserosal fibroid.  OPERATIVE PROCEDURES:  Paracervical block.  Cervical dilation with hysteroscopy.  Endometrial curettings.  NovaSure ablation.  Laparoscopic bilateral tubal ligation.  SURGEON:  Juluis Mire, MD  ANESTHESIA:  General along with paracervical block.  ESTIMATED BLOOD LOSS:  Minimal.  PACKS AND DRAINS:  None.  INTRAOPERATIVE BLOOD PLACED:  None.  COMPLICATIONS:  None.  INDICATION:  As dictated in history and physical.  PROCEDURE:  The patient was taken to the OR and placed in supine position.  After satisfactory level of general anesthesia obtained, the patient was placed in dorsal lithotomy position using the Allen stirrups.  The abdomen, perineum, and vagina were prepped out with antiseptic solution.  The patient was then draped out for hysteroscopy. A speculum was placed in the vaginal vault.  The cervix was grasped with a single-tooth tenaculum.  A paracervical block of 1% Xylocaine with epinephrine was then instituted.  Uterus sounded to 8 cm.  Endocervical length was 3.5 cm.  It given [place] a cavity depth of 4.5 cm.  Hysteroscopy was performed.  Endometrial cavity looked clear.  No polyps or abnormalities were noted.  Endometrial curettings were obtained and sent for pathological review.  NovaSure was put in place and deployed.  We had a cavity with the 4.2 cm.  We passed the patency test.  Ablation was undertaken a power of 104 for 90 seconds.  The NovaSure was removed intact.  Repeat  hysteroscopy did reveal fairly universal ablation.  It was then at this point in time, she did have an anterior submucosal fibroid that was protruding to the cavity that was still felt okay at universal ablation.  The hysteroscope was then removed.  Total deficit of 140 mL. Hulka tenaculum was put in place.  Bladder was emptied via catheterization.  The patient's legs were repositioned.  Subumbilical incision was made with knife.  The Veress needle was then introduced into the abdominal cavity.  Abdomen was deflated approximately 3 liters of carbon dioxide. Operating laparoscope was introduced.  Visualization revealed no evidence of injury to adjacent organs.  We could see the appendix and may have been retrocecal.  The liver and tip of the gallbladder were clear.  Uterus, tubes and ovaries were unremarkable.  At midsegment, each tube was coagulated for a distance of 3 cm.  Coagulation was continued until the resistance meter read 0.  We then re-coagulated the same segment of tube.  Coagulation did extend out to the mesosalpinx. Both tubes were adequately coagulated.  We did notice some ecchymoses around the lower part of the cervix probably from the paracervical block.  This area was stable and did not expand even after deflating the abdomen and reinflating.  At this point in time, the procedure was ended.  There was no evidence of injury to adjacent organs.  The laparoscope and trocars were removed.  Subumbilical incision was closed with interrupted subcuticular of 4-0 Vicryl.  The Hulka tenaculum was then removed.  The patient was then taken out of the dorsal lithotomy position.  Once alert and extubated, transferred to the recovery in good condition.  Sponge, instrument, and needle count was correct by circulating nurse x2.     Juluis Mire, M.D.     JSM/MEDQ  D:  07/29/2011  T:  07/30/2011  Job:  [id number]

## 2011-08-01 ENCOUNTER — Encounter (HOSPITAL_COMMUNITY): Payer: Self-pay | Admitting: Obstetrics and Gynecology

## 2012-01-12 ENCOUNTER — Other Ambulatory Visit: Payer: Self-pay

## 2012-01-12 MED ORDER — ZOLPIDEM TARTRATE 10 MG PO TABS: 10.0000 mg | ORAL_TABLET | Freq: Every evening | ORAL | Status: DC | PRN

## 2012-01-12 NOTE — Telephone Encounter (Signed)
Pharmacy requesting refill on Zolpidem 10 mg, medication currently not on patients medication list

## 2012-01-12 NOTE — Telephone Encounter (Signed)
Done hardcopy to robin  

## 2012-01-13 NOTE — Telephone Encounter (Signed)
Faxed hardcopy to pharmacy. 

## 2012-03-06 ENCOUNTER — Other Ambulatory Visit: Payer: Self-pay | Admitting: Internal Medicine

## 2012-03-08 ENCOUNTER — Other Ambulatory Visit (INDEPENDENT_AMBULATORY_CARE_PROVIDER_SITE_OTHER): Payer: BC Managed Care – PPO

## 2012-03-08 ENCOUNTER — Telehealth: Payer: Self-pay

## 2012-03-08 DIAGNOSIS — Z0000 Encounter for general adult medical examination without abnormal findings: Secondary | ICD-10-CM

## 2012-03-08 LAB — URINALYSIS, ROUTINE W REFLEX MICROSCOPIC
Hgb urine dipstick: NEGATIVE
Ketones, ur: NEGATIVE
Urine Glucose: NEGATIVE
Urobilinogen, UA: 0.2 (ref 0.0–1.0)

## 2012-03-08 LAB — CBC WITH DIFFERENTIAL/PLATELET
Basophils Relative: 1 % (ref 0.0–3.0)
Eosinophils Relative: 2.6 % (ref 0.0–5.0)
HCT: 39 % (ref 36.0–46.0)
Hemoglobin: 13.2 g/dL (ref 12.0–15.0)
Lymphs Abs: 1.9 10*3/uL (ref 0.7–4.0)
Monocytes Relative: 8 % (ref 3.0–12.0)
Neutro Abs: 2.2 10*3/uL (ref 1.4–7.7)
Platelets: 183 10*3/uL (ref 150.0–400.0)
RBC: 4.37 Mil/uL (ref 3.87–5.11)
WBC: 4.6 10*3/uL (ref 4.5–10.5)

## 2012-03-08 LAB — BASIC METABOLIC PANEL
BUN: 15 mg/dL (ref 6–23)
CO2: 25 mEq/L (ref 19–32)
Calcium: 8.8 mg/dL (ref 8.4–10.5)
Glucose, Bld: 92 mg/dL (ref 70–99)
Sodium: 139 mEq/L (ref 135–145)

## 2012-03-08 LAB — LIPID PANEL
HDL: 93.6 mg/dL (ref >39.00)
VLDL: 6.2 mg/dL (ref 0.0–40.0)

## 2012-03-08 LAB — LDL CHOLESTEROL, DIRECT: Direct LDL: 101.3 mg/dL

## 2012-03-08 LAB — HEPATIC FUNCTION PANEL
Albumin: 4 g/dL (ref 3.5–5.2)
Alkaline Phosphatase: 36 U/L — ABNORMAL LOW (ref 39–117)
Total Protein: 7.1 g/dL (ref 6.0–8.3)

## 2012-03-08 NOTE — Telephone Encounter (Signed)
Put order in for physical labs. 

## 2012-03-12 ENCOUNTER — Ambulatory Visit (INDEPENDENT_AMBULATORY_CARE_PROVIDER_SITE_OTHER): Payer: BC Managed Care – PPO | Admitting: Internal Medicine

## 2012-03-12 ENCOUNTER — Encounter: Payer: Self-pay | Admitting: Internal Medicine

## 2012-03-12 VITALS — BP 110/72 | HR 61 | Temp 98.5°F | Ht 68.0 in | Wt 144.1 lb

## 2012-03-12 DIAGNOSIS — G4700 Insomnia, unspecified: Secondary | ICD-10-CM | POA: Insufficient documentation

## 2012-03-12 DIAGNOSIS — Z0000 Encounter for general adult medical examination without abnormal findings: Secondary | ICD-10-CM

## 2012-03-12 MED ORDER — CITALOPRAM HYDROBROMIDE 20 MG PO TABS: 20.0000 mg | ORAL_TABLET | Freq: Every day | ORAL | Status: DC

## 2012-03-12 NOTE — Patient Instructions (Addendum)
Continue all other medications as before Please return in 1 year for your yearly visit, or sooner if needed, with Lab testing done 3-5 days before  

## 2012-03-12 NOTE — Progress Notes (Signed)
Subjective:    Patient ID: [name], female    DOB: September 30, 1970, 41 y.o.   MRN: [id number]  HPI  Here for wellness and f/u;  Overall doing ok;  Pt denies CP, worsening SOB, DOE, wheezing, orthopnea, PND, worsening LE edema, palpitations, dizziness or syncope.  Pt denies neurological change such as new Headache, facial or extremity weakness.  Pt denies polydipsia, polyuria, or low sugar symptoms. Pt states overall good compliance with treatment and medications, good tolerability, and trying to follow lower cholesterol diet.  Pt denies worsening depressive symptoms, suicidal ideation or panic. No fever, wt loss, night sweats, loss of appetite, or other constitutional symptoms.  Pt states good ability with ADL's, low fall risk, home safety reviewed and adequate, no significant changes in hearing or vision, and occasionally active with exercise.  No acute complaints, Wants to cont the citalopram, though ambien 10 mg has some next day hangover.  Also sees dermatology on a yearly basis for exam Past Medical History  Diagnosis Date  . ANXIETY 10/05/2009  . ANXIETY DEPRESSION 10/25/2007  . DEPRESSION 10/05/2009  . MIGRAINE HEADACHE 10/25/2007  . ALLERGIC RHINITIS 10/25/2007  . GERD (gastroesophageal reflux disease)     occasional-tums prn   Past Surgical History  Procedure Date  . No past surgeries   . Laparoscopic tubal ligation 07/29/2011    Procedure: LAPAROSCOPIC TUBAL LIGATION;  Surgeon: Juluis Mire;  Location: [facility];  Service: Gynecology;  Laterality: Bilateral;  . Iud removal 07/29/2011    Procedure: INTRAUTERINE DEVICE (IUD) REMOVAL;  Surgeon: Juluis Mire;  Location: [facility];  Service: Gynecology;  Laterality: N/A;  . Tubal ligation     reports that she has never smoked. She has never used smokeless tobacco. She reports that she drinks alcohol. She reports that she does not use illicit drugs. family history includes Hypertension in her father and unspecified family member. Allergies    Allergen Reactions  . Penicillins     Childhood reaction - rash    Current Outpatient Prescriptions on File Prior to Visit  Medication Sig Dispense Refill  . DISCONTD: citalopram (CELEXA) 20 MG tablet TAKE ONE TABLET BY MOUTH ONE TIME DAILY  30 tablet  0  . zolpidem (AMBIEN) 10 MG tablet Take 1 tablet (10 mg total) by mouth at bedtime as needed for sleep.  30 tablet  2   Review of Systems Review of Systems  Constitutional: Negative for diaphoresis, activity change, appetite change and unexpected weight change.  HENT: Negative for hearing loss, ear pain, facial swelling, mouth sores and neck stiffness.   Eyes: Negative for pain, redness and visual disturbance.  Respiratory: Negative for shortness of breath and wheezing.   Cardiovascular: Negative for chest pain and palpitations.  Gastrointestinal: Negative for diarrhea, blood in stool, abdominal distention and rectal pain.  Genitourinary: Negative for hematuria, flank pain and decreased urine volume.  Musculoskeletal: Negative for myalgias and joint swelling.  Skin: Negative for color change and wound.  Neurological: Negative for syncope and numbness.  Hematological: Negative for adenopathy.  Psychiatric/Behavioral: Negative for hallucinations, self-injury, decreased concentration and agitation.      Objective:   Physical Exam BP 110/72  Pulse 61  Temp 98.5 F (36.9 C) (Oral)  Ht 5\' 8"  (1.727 m)  Wt 144 lb 2 oz (65.375 kg)  BMI 21.91 kg/m2  SpO2 97% Physical Exam  VS noted Constitutional: Pt is oriented to person, place, and time. Appears well-developed and well-nourished.  HENT:  Head: Normocephalic and atraumatic.  Right Ear: External ear normal.  Left Ear: External ear normal.  Nose: Nose normal.  Mouth/Throat: Oropharynx is clear and moist.  Eyes: Conjunctivae and EOM are normal. Pupils are equal, round, and reactive to light.  Neck: Normal range of motion. Neck supple. No JVD present. No tracheal deviation present.   Cardiovascular: Normal rate, regular rhythm, normal heart sounds and intact distal pulses.   Pulmonary/Chest: Effort normal and breath sounds normal.  Abdominal: Soft. Bowel sounds are normal. There is no tenderness.  Musculoskeletal: Normal range of motion. Exhibits no edema.  Lymphadenopathy:  Has no cervical adenopathy.  Neurological: Pt is alert and oriented to person, place, and time. Pt has normal reflexes. No cranial nerve deficit.  Skin: Skin is warm and dry. No rash noted.  Psychiatric:  Has  normal mood and affect. Behavior is normal.     Assessment & Plan:

## 2012-03-12 NOTE — Assessment & Plan Note (Signed)

## 2012-03-12 NOTE — Assessment & Plan Note (Signed)
Has some 10 mg tabs at home, ok for 1/2 qqhs prn ,  to f/u any worsening symptoms or concerns

## 2012-03-26 IMAGING — CR DG CHEST 2V
2 series · 2 of 2 positions shown · non-contrast
Comparison: None.

CLINICAL DATA: Short of breath with exertion

CHEST - 2 VIEW

[view not recorded (1 of 2)]
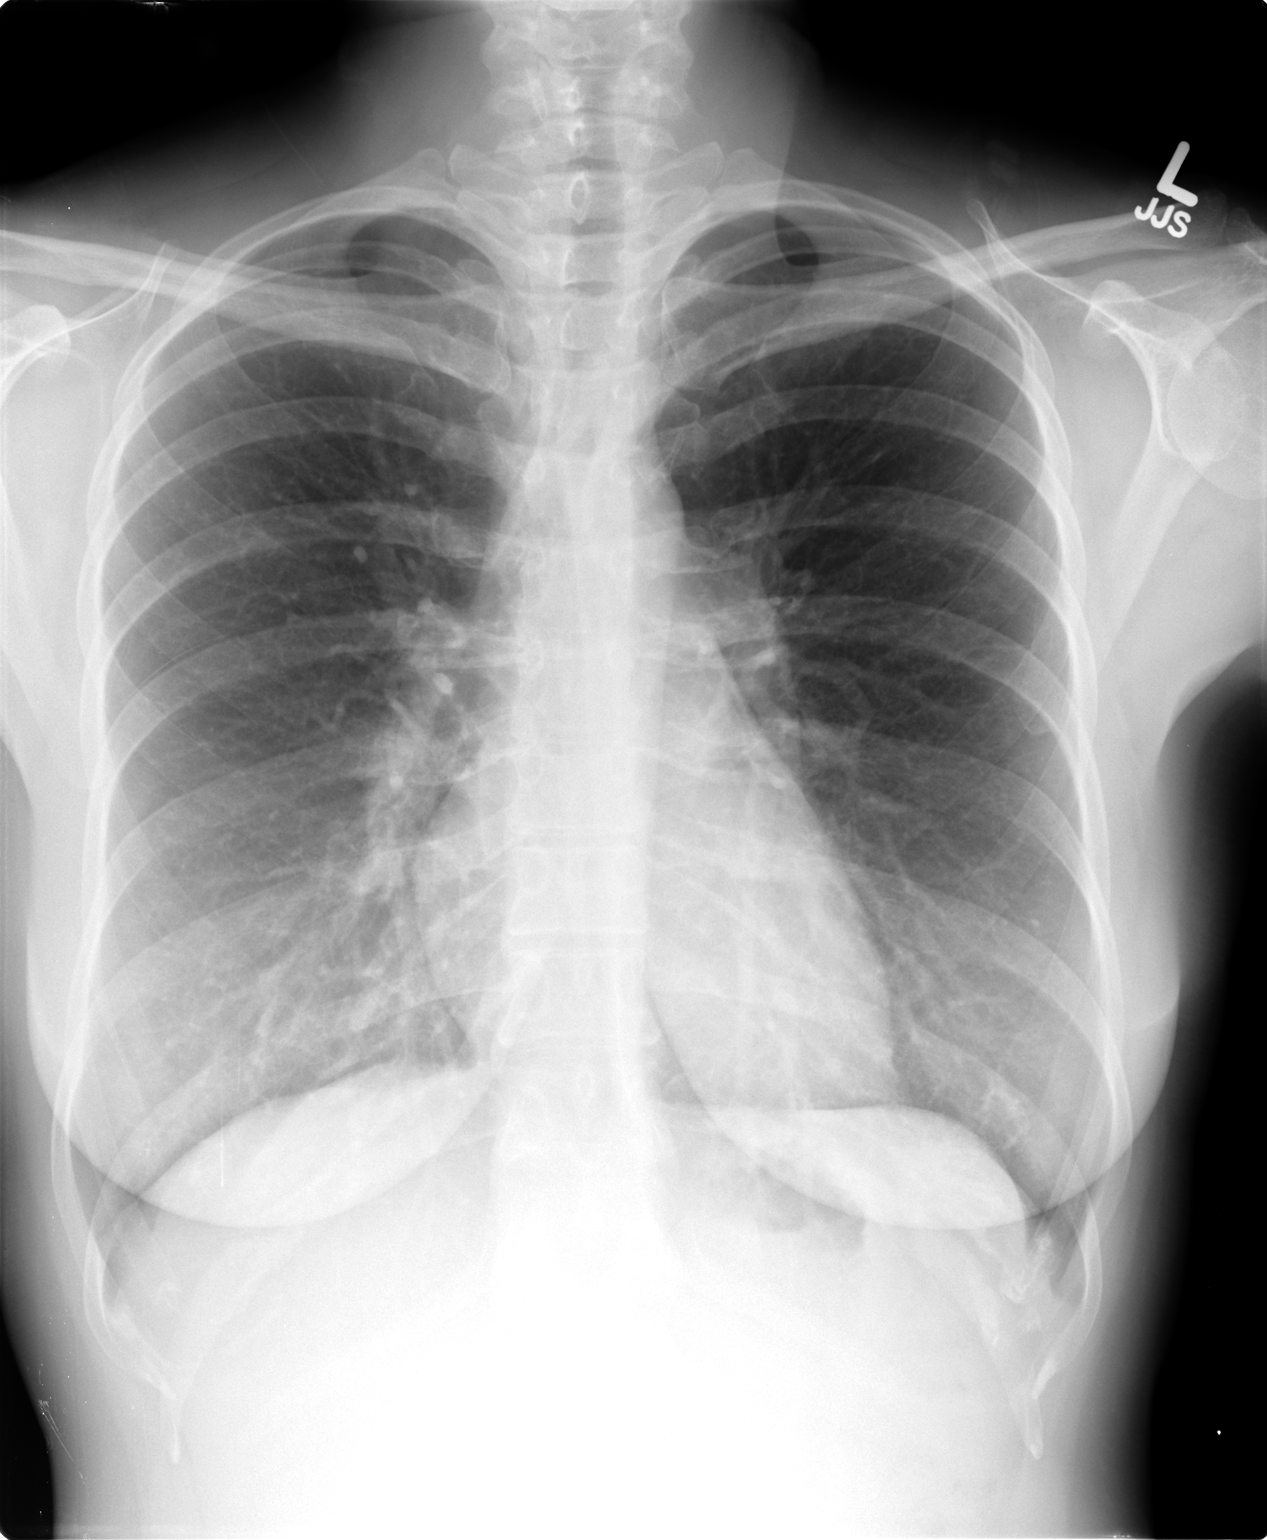

[view not recorded (2 of 2)]
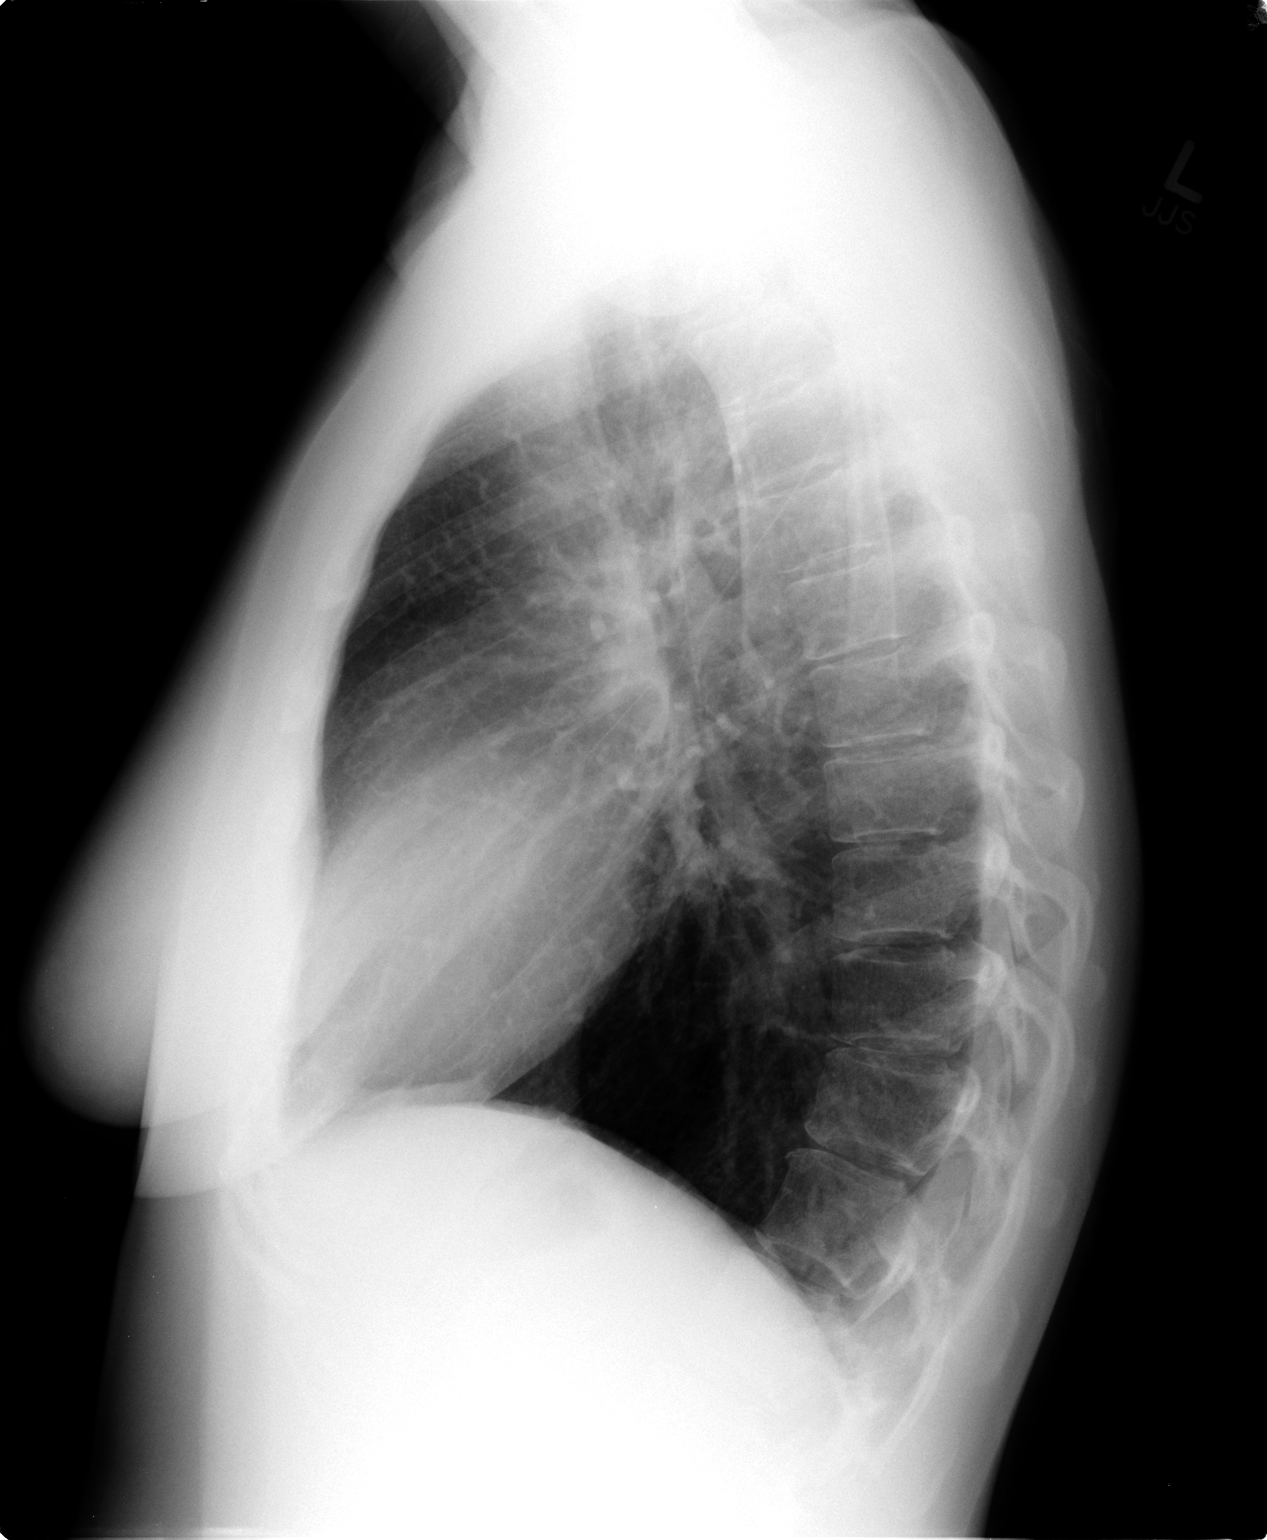

[2 of 2 positions shown; findings below may reference images not displayed]

FINDINGS: Heart and mediastinal contours normal.  Lungs clear.  No
pleural fluid.  Osseous structures and soft tissues unremarkable.
IMPRESSION: No acute or significant findings.

## 2012-04-08 ENCOUNTER — Other Ambulatory Visit: Payer: Self-pay | Admitting: Internal Medicine

## 2012-08-16 ENCOUNTER — Ambulatory Visit (INDEPENDENT_AMBULATORY_CARE_PROVIDER_SITE_OTHER): Payer: BC Managed Care – PPO | Admitting: Internal Medicine

## 2012-08-16 ENCOUNTER — Encounter: Payer: Self-pay | Admitting: Internal Medicine

## 2012-08-16 VITALS — BP 102/52 | HR 82 | Temp 99.1°F | Ht 68.0 in | Wt 144.0 lb

## 2012-08-16 DIAGNOSIS — J019 Acute sinusitis, unspecified: Secondary | ICD-10-CM

## 2012-08-16 MED ORDER — CEFUROXIME AXETIL 500 MG PO TABS: 500.0000 mg | ORAL_TABLET | Freq: Two times a day (BID) | ORAL | Status: DC

## 2012-08-16 NOTE — Patient Instructions (Addendum)

## 2012-08-16 NOTE — Progress Notes (Signed)
HPI  Pt presents to the clinic with c/o chest congestion with sputum production that started 3 weeks ago. Since that time, the chest congestion has gotten better but she has developed fatigue, sinus pain and pressure, fever and headaches. She hads been taking OTC sudafed and Mucinex without relief. She does have a history of allergies but does not take an antihistamine daily. She has had sick contacts. Nothing is making the symptoms worse, they are just lingering.  Review of Systems    Past Medical History  Diagnosis Date  . ANXIETY 10/05/2009  . ANXIETY DEPRESSION 10/25/2007  . DEPRESSION 10/05/2009  . MIGRAINE HEADACHE 10/25/2007  . ALLERGIC RHINITIS 10/25/2007  . GERD (gastroesophageal reflux disease)     occasional-tums prn    Family History  Problem Relation Age of Onset  . Hypertension      family  . Hypertension Father     History   Social History  . Marital Status: Single    Spouse Name: seperated    Number of Children: 2  . Years of Education: N/A   Occupational History  . insurance    Social History Main Topics  . Smoking status: Never Smoker   . Smokeless tobacco: Never Used  . Alcohol Use: Yes     Comment: social  . Drug Use: No  . Sexually Active: Not on file   Other Topics Concern  . Not on file   Social History Narrative  . No narrative on file    Allergies  Allergen Reactions  . Penicillins     Childhood reaction - rash      Constitutional: Positive headache, fatigue and fever. Denies abrupt weight changes.  HEENT:  Positive eye pain, pressure behind the eyes, facial pain, nasal congestion and sore throat. Denies eye redness, ear pain, ringing in the ears, wax buildup, runny nose or bloody nose. Respiratory: Positive cough and thick green sputum production. Denies difficulty breathing or shortness of breath.  Cardiovascular: Denies chest pain, chest tightness, palpitations or swelling in the hands or feet.   No other specific complaints in a  complete review of systems (except as listed in HPI above).  Objective:    General: Appears her stated age, well developed, well nourished in NAD. HEENT: Head: normal shape and size, sinuses tender to palpation; Eyes: sclera white, no icterus, conjunctiva pink, PERRLA and EOMs intact; Ears: Tm's gray and intact, normal light reflex; Nose: mucosa pink and moist, septum midline; Throat/Mouth: + PND. Teeth present, mucosa pink and moist, no exudate noted, no lesions or ulcerations noted.  Neck: Mild cervical lymphadenopathy. Neck supple, trachea midline. No massses, lumps or thyromegaly present.  Cardiovascular: Normal rate and rhythm. S1,S2 noted.  No murmur, rubs or gallops noted. No JVD or BLE edema. No carotid bruits noted. Pulmonary/Chest: Normal effort and positive vesicular breath sounds. No respiratory distress. No wheezes, rales or ronchi noted.      Assessment & Plan:   Acute bacterial sinusitis  Can use a Neti Pot which can be purchased from your local drug store. Flonase 2 sprays each nostril for 3 days and then as needed. Ceftin BID for 10 days  RTC as needed or if symptoms persist.

## 2012-09-20 ENCOUNTER — Other Ambulatory Visit: Payer: Self-pay

## 2012-09-20 MED ORDER — ZOLPIDEM TARTRATE 10 MG PO TABS: 10.0000 mg | ORAL_TABLET | Freq: Every evening | ORAL | Status: DC | PRN

## 2012-09-20 NOTE — Telephone Encounter (Signed)
Faxed hardcopy to pharmacy. 

## 2012-09-20 NOTE — Telephone Encounter (Signed)
Done hardcopy to robin  

## 2013-01-04 ENCOUNTER — Telehealth: Payer: Self-pay | Admitting: Internal Medicine

## 2013-01-04 NOTE — Telephone Encounter (Signed)
Patient Information:  Caller Name: Baleigh  Phone: [phone]  Patient: [name], [name]  Gender: Female  DOB: 07-26-1971  Age: 42 Years  PCP: Oliver Barre (Adults only)  Pregnant: No  Office Follow Up:  Does the office need to follow up with this patient?: No  Instructions For The Office: N/A   Symptoms  Reason For Call & Symptoms: Pt states she was hicking on 12/30/12 and found tick on 12/31/12, small deer tick. 24 hours after the tick removed vomting and diarrhea 01/01/13.  Reviewed Health History In EMR: Yes  Reviewed Medications In EMR: Yes  Reviewed Allergies In EMR: Yes  Reviewed Surgeries / Procedures: Yes  Date of Onset of Symptoms: 12/30/2012 OB / GYN:  LMP: 12/21/2012  Guideline(s) Used:  Tick Bite  Disposition Per Guideline:   Home Care  Reason For Disposition Reached:   Tick bite with no complications  Advice Given:  Antibiotic Ointment:  Wash the wound and your hands with soap and water after removal to prevent catching any tick disease. Apply an over-the-counter antibiotic ointment (e.g., bacitracin) to the bite once.  Expected Course:  Tick bites normally do not itch or hurt. That is why they often go unnoticed.  Call Back If:  Fever or rash occur in the next 2 weeks  Bite begins to look infected  You become worse.  Patient Will Follow Care Advice:  YES

## 2013-05-02 ENCOUNTER — Other Ambulatory Visit: Payer: Self-pay | Admitting: Internal Medicine

## 2013-10-29 ENCOUNTER — Ambulatory Visit (INDEPENDENT_AMBULATORY_CARE_PROVIDER_SITE_OTHER): Payer: BC Managed Care – PPO | Admitting: Internal Medicine

## 2013-10-29 ENCOUNTER — Encounter: Payer: Self-pay | Admitting: Internal Medicine

## 2013-10-29 ENCOUNTER — Other Ambulatory Visit (INDEPENDENT_AMBULATORY_CARE_PROVIDER_SITE_OTHER): Payer: BC Managed Care – PPO

## 2013-10-29 VITALS — BP 100/60 | HR 62 | Temp 99.3°F | Wt 141.8 lb

## 2013-10-29 DIAGNOSIS — Z0000 Encounter for general adult medical examination without abnormal findings: Secondary | ICD-10-CM

## 2013-10-29 LAB — URINALYSIS, ROUTINE W REFLEX MICROSCOPIC
BILIRUBIN URINE: NEGATIVE
Hgb urine dipstick: NEGATIVE
Ketones, ur: NEGATIVE
LEUKOCYTES UA: NEGATIVE
Nitrite: NEGATIVE
PH: 6 (ref 5.0–8.0)
Specific Gravity, Urine: >=1.03 — AB (ref 1.000–1.030)
Total Protein, Urine: NEGATIVE
UROBILINOGEN UA: 0.2 (ref 0.0–1.0)
Urine Glucose: NEGATIVE

## 2013-10-29 LAB — CBC WITH DIFFERENTIAL/PLATELET
BASOS PCT: 0.6 % (ref 0.0–3.0)
Basophils Absolute: 0 10*3/uL (ref 0.0–0.1)
EOS PCT: 1.8 % (ref 0.0–5.0)
Eosinophils Absolute: 0.1 10*3/uL (ref 0.0–0.7)
HCT: 40.6 % (ref 36.0–46.0)
Hemoglobin: 13.5 g/dL (ref 12.0–15.0)
LYMPHS PCT: 30 % (ref 12.0–46.0)
Lymphs Abs: 1.6 10*3/uL (ref 0.7–4.0)
MCHC: 33.3 g/dL (ref 30.0–36.0)
MCV: 90.3 fl (ref 78.0–100.0)
MONOS PCT: 5.6 % (ref 3.0–12.0)
Monocytes Absolute: 0.3 10*3/uL (ref 0.1–1.0)
NEUTROS PCT: 62 % (ref 43.0–77.0)
Neutro Abs: 3.4 10*3/uL (ref 1.4–7.7)
Platelets: 183 10*3/uL (ref 150.0–400.0)
RBC: 4.5 Mil/uL (ref 3.87–5.11)
RDW: 12.6 % (ref 11.5–14.6)
WBC: 5.4 10*3/uL (ref 4.5–10.5)

## 2013-10-29 LAB — BASIC METABOLIC PANEL
BUN: 18 mg/dL (ref 6–23)
CO2: 25 meq/L (ref 19–32)
Calcium: 9 mg/dL (ref 8.4–10.5)
Chloride: 107 mEq/L (ref 96–112)
Creatinine, Ser: 0.9 mg/dL (ref 0.4–1.2)
GFR: 76.79 mL/min (ref >60.00)
Glucose, Bld: 89 mg/dL (ref 70–99)
Potassium: 4.4 mEq/L (ref 3.5–5.1)
Sodium: 138 mEq/L (ref 135–145)

## 2013-10-29 LAB — LIPID PANEL
Cholesterol: 198 mg/dL (ref 0–200)
HDL: 78.9 mg/dL (ref >39.00)
LDL CALC: 115 mg/dL — AB (ref 0–99)
Total CHOL/HDL Ratio: 3
Triglycerides: 20 mg/dL (ref 0.0–149.0)
VLDL: 4 mg/dL (ref 0.0–40.0)

## 2013-10-29 LAB — HEPATIC FUNCTION PANEL
ALBUMIN: 3.9 g/dL (ref 3.5–5.2)
ALT: 9 U/L (ref 0–35)
AST: 10 U/L (ref 0–37)
Alkaline Phosphatase: 36 U/L — ABNORMAL LOW (ref 39–117)
BILIRUBIN TOTAL: 0.9 mg/dL (ref 0.3–1.2)
Bilirubin, Direct: 0.1 mg/dL (ref 0.0–0.3)
TOTAL PROTEIN: 6.9 g/dL (ref 6.0–8.3)

## 2013-10-29 LAB — TSH: TSH: 0.62 u[IU]/mL (ref 0.35–5.50)

## 2013-10-29 LAB — HIGH SENSITIVITY CRP: CRP, High Sensitivity: 0.41 mg/L (ref 0.000–5.000)

## 2013-10-29 MED ORDER — CITALOPRAM HYDROBROMIDE 20 MG PO TABS: 20.0000 mg | ORAL_TABLET | Freq: Every day | ORAL | Status: DC

## 2013-10-29 NOTE — Patient Instructions (Signed)
Please continue all other medications as before, and refills have been done if requested. Please have the pharmacy call with any other refills you may need. Please continue your efforts at being more active, low cholesterol diet, and weight control. You are otherwise up to date with prevention measures today. Please keep your appointments with your specialists as you have planned - GYN  Please go to the LAB in the Basement (turn left off the elevator) for the tests to be done today You will be contacted by phone if any changes need to be made immediately.  Otherwise, you will receive a letter about your results with an explanation, but please check with MyChart first.  Please remember to sign up for MyChart if you have not done so, as this will be important to you in the future with finding out test results, communicating by private email, and scheduling acute appointments online when needed.  Please return in 1 year for your yearly visit, or sooner if needed, with Lab testing done 3-5 days before

## 2013-10-29 NOTE — Progress Notes (Signed)
Subjective:    Patient ID: [name], female    DOB: 05-Dec-1970, 43 y.o.   MRN: [id number]  HPI   Here for wellness and f/u;  Overall doing ok;  Pt denies CP, worsening SOB, DOE, wheezing, orthopnea, PND, worsening LE edema, palpitations, dizziness or syncope.  Pt denies neurological change such as new headache, facial or extremity weakness.  Pt denies polydipsia, polyuria, or low sugar symptoms. Pt states overall good compliance with treatment and medications, good tolerability, and has been trying to follow lower cholesterol diet.  Pt denies worsening depressive symptoms, suicidal ideation or panic. No fever, night sweats, wt loss, loss of appetite, or other constitutional symptoms.  Pt states good ability with ADL's, has low fall risk, home safety reviewed and adequate, no other significant changes in hearing or vision, and occasionally active with exercise.Lost overall approx 25 lbs grad intentionally over 2-3 yrs after stress of children improved.  GYN asks/suggests to have HS - CRP.  Past Medical History  Diagnosis Date  . ANXIETY 10/05/2009  . ANXIETY DEPRESSION 10/25/2007  . DEPRESSION 10/05/2009  . MIGRAINE HEADACHE 10/25/2007  . ALLERGIC RHINITIS 10/25/2007  . GERD (gastroesophageal reflux disease)     occasional-tums prn   Past Surgical History  Procedure Laterality Date  . No past surgeries    . Laparoscopic tubal ligation  07/29/2011    Procedure: LAPAROSCOPIC TUBAL LIGATION;  Surgeon: Darlyn Chamber;  Location: [facility];  Service: Gynecology;  Laterality: Bilateral;  . Iud removal  07/29/2011    Procedure: INTRAUTERINE DEVICE (IUD) REMOVAL;  Surgeon: Darlyn Chamber;  Location: [facility];  Service: Gynecology;  Laterality: N/A;  . Tubal ligation      reports that she has never smoked. She has never used smokeless tobacco. She reports that she drinks alcohol. She reports that she does not use illicit drugs. family history includes Hypertension in her father and another family  member. Allergies  Allergen Reactions  . Penicillins     Childhood reaction - rash    Current Outpatient Prescriptions on File Prior to Visit  Medication Sig Dispense Refill  . zolpidem (AMBIEN) 10 MG tablet Take 1 tablet (10 mg total) by mouth at bedtime as needed for sleep.  30 tablet  2   No current facility-administered medications on file prior to visit.     Review of Systems Constitutional: Negative for diaphoresis, activity change, appetite change or unexpected weight change.  HENT: Negative for hearing loss, ear pain, facial swelling, mouth sores and neck stiffness.   Eyes: Negative for pain, redness and visual disturbance.  Respiratory: Negative for shortness of breath and wheezing.   Cardiovascular: Negative for chest pain and palpitations.  Gastrointestinal: Negative for diarrhea, blood in stool, abdominal distention or other pain Genitourinary: Negative for hematuria, flank pain or change in urine volume.  Musculoskeletal: Negative for myalgias and joint swelling.  Skin: Negative for color change and wound.  Neurological: Negative for syncope and numbness. other than noted Hematological: Negative for adenopathy.  Psychiatric/Behavioral: Negative for hallucinations, self-injury, decreased concentration and agitation.      Objective:   Physical Exam BP 100/60  Pulse 62  Temp(Src) 99.3 F (37.4 C) (Oral)  Wt 141 lb 12 oz (64.297 kg)  SpO2 99% VS noted,  Constitutional: Pt is oriented to person, place, and time. Appears well-developed and well-nourished.  Head: Normocephalic and atraumatic.  Right Ear: External ear normal.  Left Ear: External ear normal.  Nose: Nose normal.  Mouth/Throat: Oropharynx is  clear and moist.  Eyes: Conjunctivae and EOM are normal. Pupils are equal, round, and reactive to light.  Neck: Normal range of motion. Neck supple. No JVD present. No tracheal deviation present.  Cardiovascular: Normal rate, regular rhythm, normal heart sounds and  intact distal pulses.   Pulmonary/Chest: Effort normal and breath sounds normal.  Abdominal: Soft. Bowel sounds are normal. There is no tenderness. No HSM  Musculoskeletal: Normal range of motion. Exhibits no edema.  Lymphadenopathy:  Has no cervical adenopathy.  Neurological: Pt is alert and oriented to person, place, and time. Pt has normal reflexes. No cranial nerve deficit.  Skin: Skin is warm and dry. No rash noted.  Psychiatric:  Has  normal mood and affect. Behavior is normal.      Assessment & Plan:

## 2013-10-29 NOTE — Progress Notes (Signed)
Pre visit review using our clinic review tool, if applicable. No additional management support is needed unless otherwise documented below in the visit note. 

## 2013-10-29 NOTE — Assessment & Plan Note (Addendum)

## 2013-11-01 ENCOUNTER — Encounter: Payer: Self-pay | Admitting: Internal Medicine

## 2014-01-22 ENCOUNTER — Telehealth: Payer: Self-pay | Admitting: Internal Medicine

## 2014-01-22 MED ORDER — VALACYCLOVIR HCL 1 G PO TABS: 1000.0000 mg | ORAL_TABLET | Freq: Two times a day (BID) | ORAL | Status: DC

## 2014-01-22 NOTE — Telephone Encounter (Signed)
Patient informed. 

## 2014-01-22 NOTE — Telephone Encounter (Signed)
Done erx - valtrex

## 2014-01-22 NOTE — Telephone Encounter (Signed)
Pt called stated that she has fever blister and she would like doctor John to call in something for her at [facility]. Please call pt

## 2014-07-17 ENCOUNTER — Other Ambulatory Visit: Payer: Self-pay | Admitting: Obstetrics and Gynecology

## 2014-07-22 LAB — CYTOLOGY - PAP

## 2014-10-08 ENCOUNTER — Ambulatory Visit (INDEPENDENT_AMBULATORY_CARE_PROVIDER_SITE_OTHER): Payer: 59 | Admitting: Internal Medicine

## 2014-10-08 ENCOUNTER — Encounter: Payer: Self-pay | Admitting: Internal Medicine

## 2014-10-08 ENCOUNTER — Ambulatory Visit (INDEPENDENT_AMBULATORY_CARE_PROVIDER_SITE_OTHER)
Admission: RE | Admit: 2014-10-08 | Discharge: 2014-10-08 | Disposition: A | Discharge status: Home / Self Care | Payer: 59 | Source: Ambulatory Visit | Attending: Internal Medicine | Admitting: Internal Medicine

## 2014-10-08 VITALS — BP 114/70 | HR 61 | Temp 98.9°F | Ht 68.0 in | Wt 144.0 lb

## 2014-10-08 DIAGNOSIS — M25551 Pain in right hip: Secondary | ICD-10-CM

## 2014-10-08 DIAGNOSIS — F411 Generalized anxiety disorder: Secondary | ICD-10-CM

## 2014-10-08 DIAGNOSIS — M542 Cervicalgia: Secondary | ICD-10-CM | POA: Insufficient documentation

## 2014-10-08 MED ORDER — NAPROXEN 500 MG PO TABS: 500.0000 mg | ORAL_TABLET | Freq: Two times a day (BID) | ORAL | Status: DC | PRN

## 2014-10-08 MED ORDER — VALACYCLOVIR HCL 1 G PO TABS: 1000.0000 mg | ORAL_TABLET | Freq: Two times a day (BID) | ORAL | Status: DC

## 2014-10-08 MED ORDER — CITALOPRAM HYDROBROMIDE 10 MG PO TABS: 10.0000 mg | ORAL_TABLET | Freq: Every day | ORAL | Status: DC

## 2014-10-08 MED ORDER — CYCLOBENZAPRINE HCL 5 MG PO TABS: 5.0000 mg | ORAL_TABLET | Freq: Three times a day (TID) | ORAL | Status: DC | PRN

## 2014-10-08 MED ORDER — PREDNISONE 10 MG PO TABS: ORAL_TABLET | ORAL | Status: DC

## 2014-10-08 NOTE — Patient Instructions (Addendum)
Please take all new medication as prescribed - the antiinflammatory, prednisone, and muscle relaxer as needed  Please continue all other medications as before, and refills have been done if requested - the valtrex  OK to decrease the celexa to 10 mg per day  Please have the pharmacy call with any other refills you may need.  Please keep your appointments with your specialists as you may have planned  Please go to the XRAY Department in the Basement (go straight as you get off the elevator) for the x-ray testing  You will be contacted by phone if any changes need to be made immediately.  Otherwise, you will receive a letter about your results with an explanation, but please check with MyChart first.  Please remember to sign up for MyChart if you have not done so, as this will be important to you in the future with finding out test results, communicating by private email, and scheduling acute appointments online when needed.  Please consider seeing Dr Smith/sport medicine if your symptoms do not improve in the next week

## 2014-10-08 NOTE — Progress Notes (Signed)
Pre visit review using our clinic review tool, if applicable. No additional management support is needed unless otherwise documented below in the visit note. 

## 2014-10-08 NOTE — Progress Notes (Deleted)
   Subjective:    Patient ID: [name], female    DOB: 1970-10-09, 44 y.o.   MRN: [id number]  HPI  Here with acute on recurrent right neck pain, flared recently after originally having a pop to neck years ago with bending to plug in an appliance. Has seen chiropracter approx 2 yrs ago, told she may have problem at c3-4, ibuprofen helps but only takes very occasionally. Most recently, has now flare for 2 months right side, mild to mod, not activity limiting, has been some strecthcin exercise. Can radiate to upper back,  bowel or bladder change, fever, wt loss,  worsening LE pain/numbness/weakness, gait change or falls.  Also with 2 mo right lateral hip area   Stands ineverning 3 times per wk up to 5 hrs.      Review of Systems     Objective:   Physical Exam        Assessment & Plan:

## 2014-10-13 NOTE — Assessment & Plan Note (Signed)
Ok for decrease celexa 10 qd, but consider increase for recurrent worsening anxiety

## 2014-10-13 NOTE — Progress Notes (Signed)
Subjective:    Patient ID: [name], female    DOB: [birth year], 44 y.o.   MRN: [id number]  HPI  Here with acute on recurrent right neck pain, flared recently after originally having a pop to neck years ago with bending to plug in an appliance. Has seen chiropracter approx 2 yrs ago, told she may have problem at c3-4, ibuprofen helps but only takes very occasionally. Most recently, has now flare for 2 months right side, mild to mod, not activity limiting, has been some improved with stretching exercise. Can radiate to upper back,  bowel or bladder change, fever, wt loss,  worsening LE pain/numbness/weakness, gait change or falls.  Also with 2 mo right lateral hip area , mild to mod, ? Worse recent, worse to walk, not clear if relates to lower back, worse to lie on right side, also worse with prolonged standing -  Stands ineverning 3 times per wk up to 5 hrs.   Feeling ok with anxiety and depression recently, stable, would like to decrease the celexa on trial basis to 10 mg, no SI Past Medical History  Diagnosis Date  . ANXIETY 10/05/2009  . ANXIETY DEPRESSION 10/25/2007  . DEPRESSION 10/05/2009  . MIGRAINE HEADACHE 10/25/2007  . ALLERGIC RHINITIS 10/25/2007  . GERD (gastroesophageal reflux disease)     occasional-tums prn   Past Surgical History  Procedure Laterality Date  . No past surgeries    . Laparoscopic tubal ligation  07/29/2011    Procedure: LAPAROSCOPIC TUBAL LIGATION;  Surgeon: Darlyn Chamber;  Location: [facility];  Service: Gynecology;  Laterality: Bilateral;  . Iud removal  07/29/2011    Procedure: INTRAUTERINE DEVICE (IUD) REMOVAL;  Surgeon: Darlyn Chamber;  Location: [facility];  Service: Gynecology;  Laterality: N/A;  . Tubal ligation      reports that she has never smoked. She has never used smokeless tobacco. She reports that she drinks alcohol. She reports that she does not use illicit drugs. family history includes Hypertension in her father and another family member. Allergies    Allergen Reactions  . Penicillins     Childhood reaction - rash    Current Outpatient Prescriptions on File Prior to Visit  Medication Sig Dispense Refill  . zolpidem (AMBIEN) 10 MG tablet Take 1 tablet (10 mg total) by mouth at bedtime as needed for sleep. 30 tablet 2   No current facility-administered medications on file prior to visit.   Review of Systems  Constitutional: Negative for unusual diaphoresis or other sweats  HENT: Negative for ringing in ear Eyes: Negative for double vision or worsening visual disturbance.  Respiratory: Negative for choking and stridor.   Gastrointestinal: Negative for vomiting or other signifcant bowel change Genitourinary: Negative for hematuria or decreased urine volume.  Musculoskeletal: Negative for other MSK pain or swelling Skin: Negative for color change and worsening wound.  Neurological: Negative for tremors and numbness other than noted  Psychiatric/Behavioral: Negative for decreased concentration or agitation other than above       Objective:   Physical Exam BP 114/70 mmHg  Pulse 61  Temp(Src) 98.9 F (37.2 C) (Oral)  Ht 5' 8"  (1.727 m)  Wt 144 lb (65.318 kg)  BMI 21.90 kg/m2  LMP 10/08/2014 VS noted, non toxic Constitutional: Pt appears well-developed, well-nourished.  HENT: Head: NCAT.  Right Ear: External ear normal.  Left Ear: External ear normal.  Eyes: . Pupils are equal, round, and reactive to light. Conjunctivae and EOM are normal Neck: Normal range of  motion. Neck supple.  mild right paracervical tender, no swelling, rash, spine nontender throughout Cardiovascular: Normal rate and regular rhythm.   Pulmonary/Chest: Effort normal and breath sounds without rales or wheezing.  Abd:  Soft, NT, ND, + BS Tender also over right iliac crest area without swelling, red, rash Neurological: Pt is alert. Not confused , motor grossly intact Skin: Skin is warm. No rash Psychiatric: Pt behavior is normal. No agitation. Not  depressed affect, mild nervous    Assessment & Plan:   Subjective:    Patient ID: [name], female    DOB: 1970/12/31, 44 y.o.   MRN: [id number]  HPI Review of Systems  Objective:   Physical Exam    Assessment & Plan:

## 2014-10-13 NOTE — Assessment & Plan Note (Addendum)
C/w msk strain vs radicular pain vs bursitis, neuro no change, for pain control with nsaid prn, muscle relaxer prn,  to f/u any worsening symptoms or concerns

## 2014-10-13 NOTE — Assessment & Plan Note (Addendum)
C/w msk strain vs underlying c-spine djd or ddd, neuro no change, for pain control with nsaid prn, muscle relaxer prn and predpac asd,  to f/u any worsening symptoms or concerns, for film today to assess for DJD

## 2014-10-14 ENCOUNTER — Telehealth: Payer: Self-pay | Admitting: *Deleted

## 2014-10-14 NOTE — Telephone Encounter (Signed)
Pt states since she started taking the prednisone she has been feeling light headed & dizzy. Sleeping a lot and even depress. She has only took 4 days of meds. Can't take the cyclobenzaprine doesn't l;ike the way it make her feel. Wanting to know is this a normal side effect from steroid & how long does it takes to get out of her system...[name]

## 2014-10-15 NOTE — Telephone Encounter (Signed)
Prednisone normally wears off within a day or two.  I can change the muscle relaxer zanaflex if she would want to try a different one. thanks

## 2014-10-15 NOTE — Telephone Encounter (Signed)
Called pt no answer LMOM with md response.../lmb 

## 2014-12-25 ENCOUNTER — Encounter: Payer: Self-pay | Admitting: Internal Medicine

## 2014-12-25 DIAGNOSIS — M542 Cervicalgia: Secondary | ICD-10-CM

## 2015-01-28 ENCOUNTER — Encounter: Payer: Self-pay | Admitting: Internal Medicine

## 2015-01-29 ENCOUNTER — Other Ambulatory Visit: Payer: Self-pay

## 2015-01-29 MED ORDER — ZOLPIDEM TARTRATE 10 MG PO TABS: 10.0000 mg | ORAL_TABLET | Freq: Every evening | ORAL | Status: DC | PRN

## 2015-01-29 NOTE — Telephone Encounter (Signed)
Rx faxed to pharmacy  

## 2015-05-22 ENCOUNTER — Telehealth: Payer: Self-pay | Admitting: Internal Medicine

## 2015-05-22 MED ORDER — ZOLPIDEM TARTRATE 10 MG PO TABS: 10.0000 mg | ORAL_TABLET | Freq: Every evening | ORAL | Status: DC | PRN

## 2015-05-22 NOTE — Addendum Note (Signed)
Addended by: Biagio Borg on: 05/22/2015 12:57 PM   Modules accepted: Orders

## 2015-05-22 NOTE — Telephone Encounter (Signed)
Done hardcopy to Dahlia  

## 2015-10-14 ENCOUNTER — Other Ambulatory Visit (INDEPENDENT_AMBULATORY_CARE_PROVIDER_SITE_OTHER): Payer: BLUE CROSS/BLUE SHIELD

## 2015-10-14 ENCOUNTER — Encounter: Payer: Self-pay | Admitting: Internal Medicine

## 2015-10-14 ENCOUNTER — Ambulatory Visit (INDEPENDENT_AMBULATORY_CARE_PROVIDER_SITE_OTHER): Payer: BLUE CROSS/BLUE SHIELD | Admitting: Internal Medicine

## 2015-10-14 VITALS — BP 124/76 | HR 65 | Temp 98.4°F | Resp 20 | Ht 68.0 in | Wt 137.0 lb

## 2015-10-14 DIAGNOSIS — Z0000 Encounter for general adult medical examination without abnormal findings: Secondary | ICD-10-CM

## 2015-10-14 LAB — BASIC METABOLIC PANEL
BUN: 17 mg/dL (ref 6–23)
CHLORIDE: 107 meq/L (ref 96–112)
CO2: 26 meq/L (ref 19–32)
CREATININE: 0.78 mg/dL (ref 0.40–1.20)
Calcium: 9.2 mg/dL (ref 8.4–10.5)
GFR: 85.17 mL/min (ref >60.00)
Glucose, Bld: 101 mg/dL — ABNORMAL HIGH (ref 70–99)
Potassium: 4.5 mEq/L (ref 3.5–5.1)
Sodium: 140 mEq/L (ref 135–145)

## 2015-10-14 LAB — CBC WITH DIFFERENTIAL/PLATELET
BASOS ABS: 0.1 10*3/uL (ref 0.0–0.1)
Basophils Relative: 0.9 % (ref 0.0–3.0)
EOS ABS: 0.4 10*3/uL (ref 0.0–0.7)
Eosinophils Relative: 6.1 % — ABNORMAL HIGH (ref 0.0–5.0)
HEMATOCRIT: 41.5 % (ref 36.0–46.0)
HEMOGLOBIN: 14.3 g/dL (ref 12.0–15.0)
LYMPHS PCT: 27.6 % (ref 12.0–46.0)
Lymphs Abs: 1.7 10*3/uL (ref 0.7–4.0)
MCHC: 34.4 g/dL (ref 30.0–36.0)
MCV: 88.2 fl (ref 78.0–100.0)
Monocytes Absolute: 0.4 10*3/uL (ref 0.1–1.0)
Monocytes Relative: 6.7 % (ref 3.0–12.0)
Neutro Abs: 3.6 10*3/uL (ref 1.4–7.7)
Neutrophils Relative %: 58.7 % (ref 43.0–77.0)
Platelets: 205 10*3/uL (ref 150.0–400.0)
RBC: 4.71 Mil/uL (ref 3.87–5.11)
RDW: 12.5 % (ref 11.5–15.5)
WBC: 6.1 10*3/uL (ref 4.0–10.5)

## 2015-10-14 LAB — LIPID PANEL
CHOL/HDL RATIO: 3
Cholesterol: 213 mg/dL — ABNORMAL HIGH (ref 0–200)
HDL: 78.1 mg/dL (ref >39.00)
LDL Cholesterol: 126 mg/dL — ABNORMAL HIGH (ref 0–99)
NonHDL: 135.09
TRIGLYCERIDES: 47 mg/dL (ref 0.0–149.0)
VLDL: 9.4 mg/dL (ref 0.0–40.0)

## 2015-10-14 LAB — HEPATIC FUNCTION PANEL
ALT: 10 U/L (ref 0–35)
AST: 14 U/L (ref 0–37)
Albumin: 4.5 g/dL (ref 3.5–5.2)
Alkaline Phosphatase: 39 U/L (ref 39–117)
BILIRUBIN TOTAL: 0.7 mg/dL (ref 0.2–1.2)
Bilirubin, Direct: 0.1 mg/dL (ref 0.0–0.3)
TOTAL PROTEIN: 7.3 g/dL (ref 6.0–8.3)

## 2015-10-14 LAB — URINALYSIS, ROUTINE W REFLEX MICROSCOPIC
Hgb urine dipstick: NEGATIVE
KETONES UR: NEGATIVE
Leukocytes, UA: NEGATIVE
NITRITE: NEGATIVE
PH: 5.5 (ref 5.0–8.0)
RBC / HPF: NONE SEEN (ref 0–?)
Specific Gravity, Urine: >=1.03 — AB (ref 1.000–1.030)
TOTAL PROTEIN, URINE-UPE24: NEGATIVE
UROBILINOGEN UA: 0.2 (ref 0.0–1.0)
Urine Glucose: NEGATIVE

## 2015-10-14 LAB — TSH: TSH: 1.4 u[IU]/mL (ref 0.35–4.50)

## 2015-10-14 NOTE — Progress Notes (Signed)
Subjective:    Patient ID: [name], female    DOB: [birth year], 45 y.o.   MRN: [id number]  HPI  Here for wellness and f/u;  Overall doing ok;  Pt denies Chest pain, worsening SOB, DOE, wheezing, orthopnea, PND, worsening LE edema, palpitations, dizziness or syncope.  Pt denies neurological change such as new headache, facial or extremity weakness.  Pt denies polydipsia, polyuria, or low sugar symptoms. Pt states overall good compliance with treatment and medications, good tolerability, and has been trying to follow appropriate diet.  Pt denies worsening depressive symptoms, suicidal ideation or panic. No fever, night sweats, wt loss, loss of appetite, or other constitutional symptoms.  Pt states good ability with ADL's, has low fall risk, home safety reviewed and adequate, no other significant changes in hearing or vision, and only occasionally active with exercise.  Has been able to wean the celexa to 10 mg, may try to wean off at some point.   Wt Readings from Last 3 Encounters:  10/14/15 137 lb (62.143 kg)  10/08/14 144 lb (65.318 kg)  10/29/13 141 lb 12 oz (64.297 kg)  Declines flu shot. Past Medical History  Diagnosis Date  . ANXIETY 10/05/2009  . ANXIETY DEPRESSION 10/25/2007  . DEPRESSION 10/05/2009  . MIGRAINE HEADACHE 10/25/2007  . ALLERGIC RHINITIS 10/25/2007  . GERD (gastroesophageal reflux disease)     occasional-tums prn   Past Surgical History  Procedure Laterality Date  . No past surgeries    . Laparoscopic tubal ligation  07/29/2011    Procedure: LAPAROSCOPIC TUBAL LIGATION;  Surgeon: Darlyn Chamber;  Location: [facility];  Service: Gynecology;  Laterality: Bilateral;  . Iud removal  07/29/2011    Procedure: INTRAUTERINE DEVICE (IUD) REMOVAL;  Surgeon: Darlyn Chamber;  Location: [facility];  Service: Gynecology;  Laterality: N/A;  . Tubal ligation      reports that she has never smoked. She has never used smokeless tobacco. She reports that she drinks alcohol. She reports that she  does not use illicit drugs. family history includes Hypertension in her father. Allergies  Allergen Reactions  . Penicillins     Childhood reaction - rash    Current Outpatient Prescriptions on File Prior to Visit  Medication Sig Dispense Refill  . citalopram (CELEXA) 10 MG tablet Take 1 tablet (10 mg total) by mouth daily. 30 tablet 11  . valACYclovir (VALTREX) 1000 MG tablet Take 1 tablet (1,000 mg total) by mouth 2 (two) times daily. 14 tablet 0  . zolpidem (AMBIEN) 10 MG tablet Take 1 tablet (10 mg total) by mouth at bedtime as needed for sleep. 30 tablet 2   No current facility-administered medications on file prior to visit.    Review of Systems Constitutional: Negative for increased diaphoresis, other activity, appetite or siginficant weight change other than noted HENT: Negative for worsening hearing loss, ear pain, facial swelling, mouth sores and neck stiffness.   Eyes: Negative for other worsening pain, redness or visual disturbance.  Respiratory: Negative for shortness of breath and wheezing  Cardiovascular: Negative for chest pain and palpitations.  Gastrointestinal: Negative for diarrhea, blood in stool, abdominal distention or other pain Genitourinary: Negative for hematuria, flank pain or change in urine volume.  Musculoskeletal: Negative for myalgias or other joint complaints.  Skin: Negative for color change and wound or drainage.  Neurological: Negative for syncope and numbness. other than noted Hematological: Negative for adenopathy. or other swelling Psychiatric/Behavioral: Negative for hallucinations, SI, self-injury, decreased concentration or other worsening agitation.  Objective:   Physical Exam BP 124/76 mmHg  Pulse 65  Temp(Src) 98.4 F (36.9 C) (Oral)  Resp 20  Ht 5' 8"  (1.727 m)  Wt 137 lb (62.143 kg)  BMI 20.84 kg/m2  SpO2 98% VS noted,  Constitutional: Pt is oriented to person, place, and time. Appears well-developed and well-nourished,  in no significant distress Head: Normocephalic and atraumatic.  Right Ear: External ear normal.  Left Ear: External ear normal.  Nose: Nose normal.  Mouth/Throat: Oropharynx is clear and moist.  Eyes: Conjunctivae and EOM are normal. Pupils are equal, round, and reactive to light.  Neck: Normal range of motion. Neck supple. No JVD present. No tracheal deviation present or significant neck LA or mass Cardiovascular: Normal rate, regular rhythm, normal heart sounds and intact distal pulses.   Pulmonary/Chest: Effort normal and breath sounds without rales or wheezing  Abdominal: Soft. Bowel sounds are normal. NT. No HSM  Musculoskeletal: Normal range of motion. Exhibits no edema.  Lymphadenopathy:  Has no cervical adenopathy.  Neurological: Pt is alert and oriented to person, place, and time. Pt has normal reflexes. No cranial nerve deficit. Motor grossly intact Skin: Skin is warm and dry. No rash noted.  Psychiatric:  Has normal mood and affect. Behavior is normal.         Assessment & Plan:

## 2015-10-14 NOTE — Progress Notes (Signed)
Pre visit review using our clinic review tool, if applicable. No additional management support is needed unless otherwise documented below in the visit note. 

## 2015-10-14 NOTE — Patient Instructions (Signed)

## 2015-10-14 NOTE — Assessment & Plan Note (Signed)

## 2015-10-23 ENCOUNTER — Other Ambulatory Visit: Payer: Self-pay | Admitting: Internal Medicine

## 2015-11-21 ENCOUNTER — Other Ambulatory Visit: Payer: Self-pay | Admitting: Internal Medicine

## 2015-12-28 ENCOUNTER — Other Ambulatory Visit: Payer: Self-pay | Admitting: Internal Medicine

## 2016-01-11 IMAGING — CR DG CERVICAL SPINE COMPLETE 4+V
5 series · 5 of 5 positions shown · non-contrast
Comparison: None.

CLINICAL DATA: Initial evaluation for chronic posterior neck pain
with no known injury, cervical spine pain

EXAM:
CERVICAL SPINE  4+ VIEWS

[view not recorded (1 of 5)]
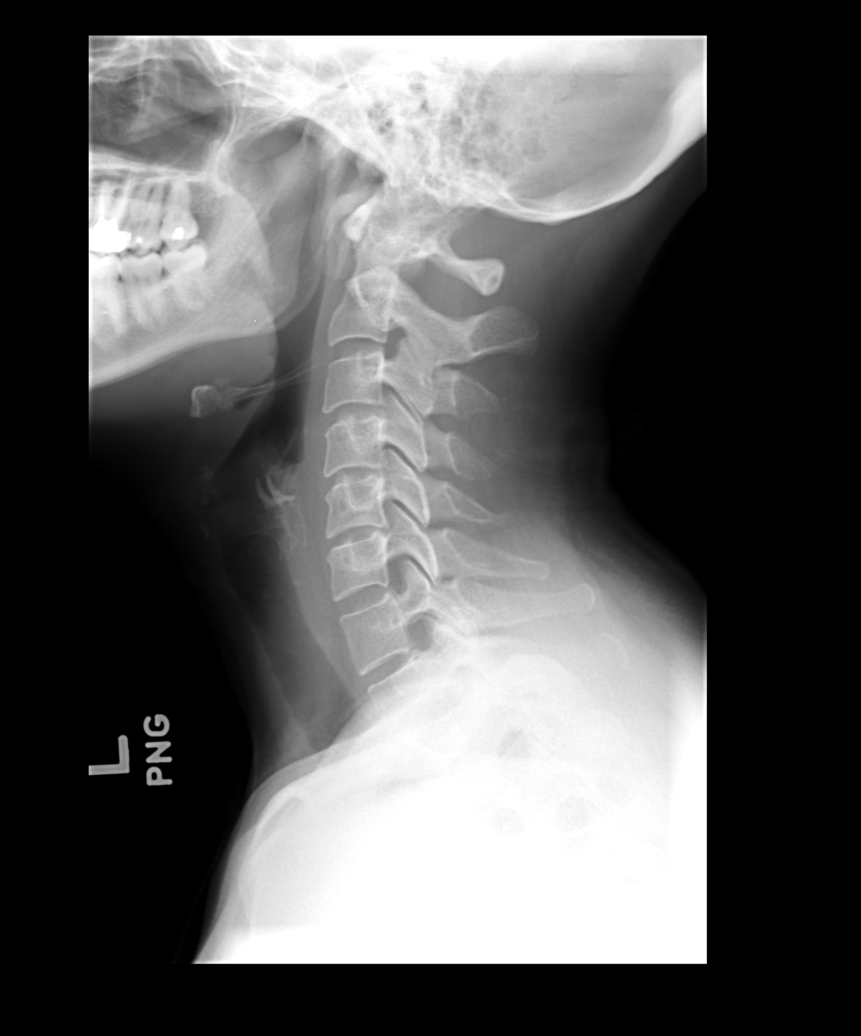

[view not recorded (2 of 5)]
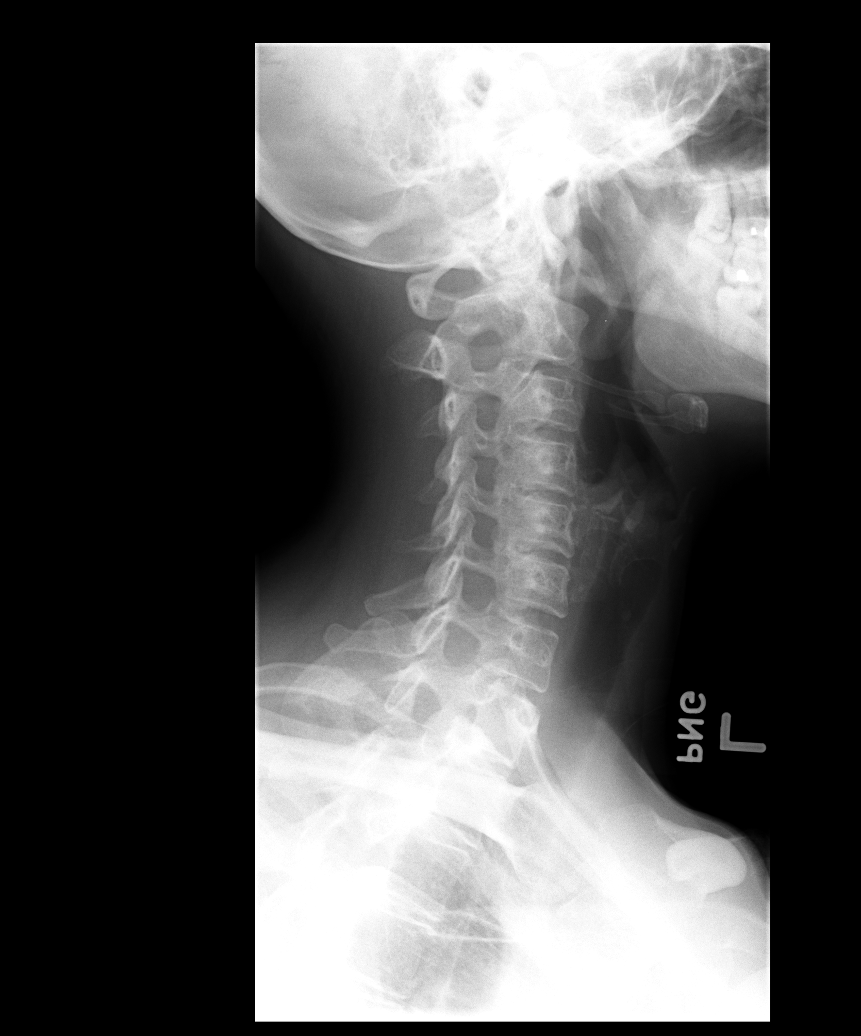

[view not recorded (3 of 5)]
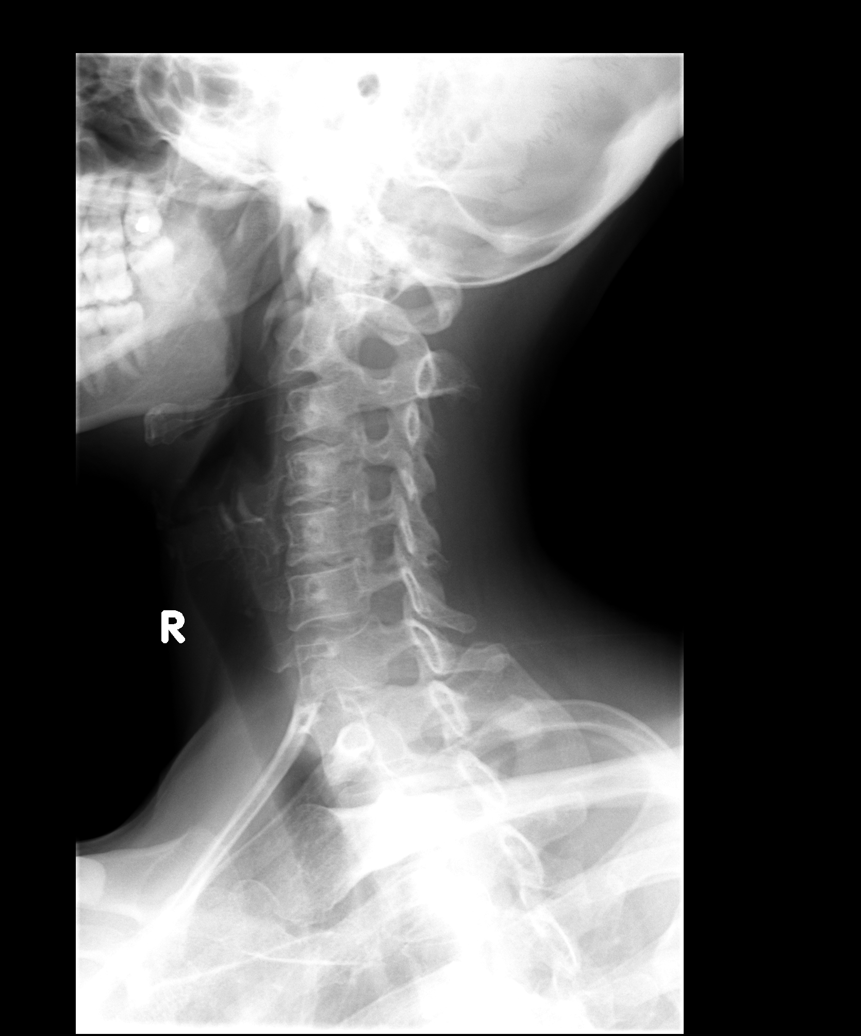

[view not recorded (4 of 5)]
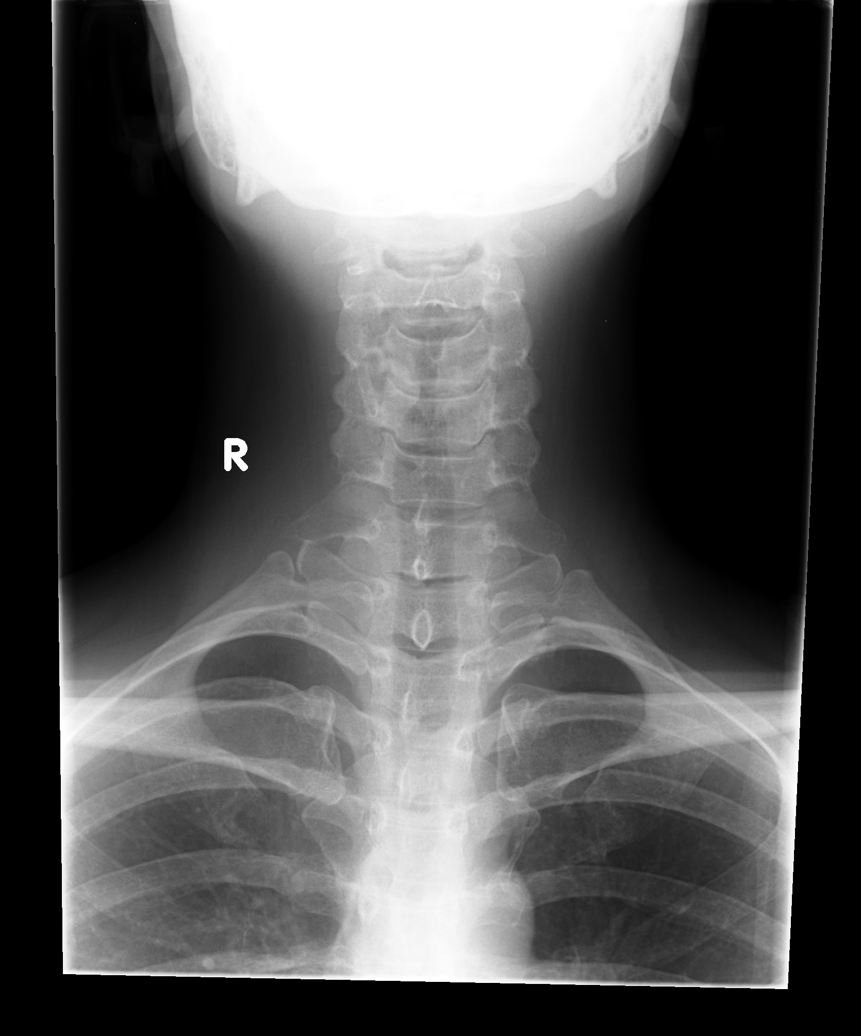

[view not recorded (5 of 5)]
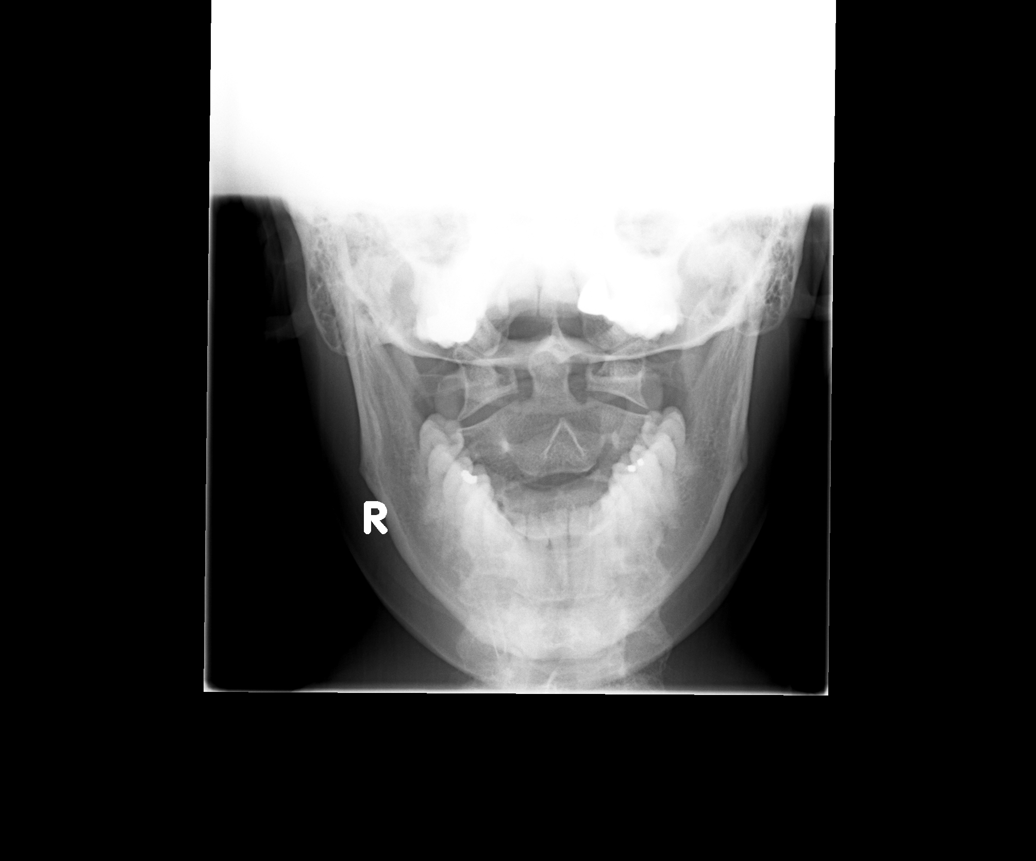

[5 of 5 positions shown; findings below may reference images not displayed]

FINDINGS: No prevertebral soft tissue swelling. Normal alignment. Minimal
C3-4, C4-5, and C5-6 degenerative disc disease. Mild C7-T1
degenerative facet change. No significant foraminal narrowing. There
are very small bilateral cervical ribs.
IMPRESSION: Minimal degenerative change.  No acute findings.

## 2016-01-29 ENCOUNTER — Telehealth: Payer: BLUE CROSS/BLUE SHIELD | Admitting: Family

## 2016-01-29 ENCOUNTER — Encounter: Payer: Self-pay | Admitting: Internal Medicine

## 2016-01-29 DIAGNOSIS — L237 Allergic contact dermatitis due to plants, except food: Secondary | ICD-10-CM

## 2016-01-29 MED ORDER — TRIAMCINOLONE ACETONIDE 0.1 % EX CREA: 1.0000 "application " | TOPICAL_CREAM | Freq: Two times a day (BID) | CUTANEOUS | Status: DC

## 2016-01-29 MED ORDER — DOXYCYCLINE HYCLATE 100 MG PO TABS: 100.0000 mg | ORAL_TABLET | Freq: Two times a day (BID) | ORAL | Status: DC

## 2016-01-29 MED ORDER — PREDNISONE 10 MG (21) PO TBPK: 10.0000 mg | ORAL_TABLET | ORAL | Status: DC

## 2016-01-29 NOTE — Progress Notes (Signed)
We are sorry that you are not feeing well.  Here is how we plan to help!  Based on what you have shared with me it looks like you have had an allergic reaction to the oily resin from a group of plants.  This resin is very sticky, so it easily attaches to your skin, clothing, tools equipment, and pet's fur.    This blistering rash is often called poison ivy rash although it can come from contact with the leaves, stems and roots of poison ivy, poison oak and poison sumac.  The oily resin contains urushiol (u-ROO-she-ol) that produces a skin rash on exposed skin.  The severity of the rash depends on the amount of urushiol that gets on your skin.  A section of skin with more urushiol on it may develop a rash sooner.  The rash usually develops 12-48 hours after exposure and can last two to three weeks.  Your skin must come in direct contact with the plant's oil to be affected.  Blister fluid doesn't spread the rash.  However, if you come into contact with a piece of clothing or pet fur that has urushiol on it, the rash may spread out.  You can also transfer the oil to other parts of your body with your fingers.  Often the rash looks like a straight line because of the way the plant brushes against your skin.    I have developed the following plan to treat your condition I suspect that a bacterial infection has developed at the rash site, and I have given you both an oral steroid and an oral antibiotic.  I have sent a prednisone dose pack to your chosen pharmacy. Be sure to follow the instructions carefully and complete the entire prescription.  You may use Benadryl or Caladryl topical lotions to sooth the itch and remember cool, not hot, showers and baths can help relieve the itching!  I have also sent in a prescription for an antibiotic: doxycycline 100 mg twice a day for 10 days.   Please schedule an appointment with your primary care provider to follow up on your skin infection within 2 weeks.  If drainage  and red streaking continue to spread you should be seen urgently.  Make sure that the clothes you were wearing and any towels or sheets that may have come in contact with the oil (urushiol) are washed in detergent and hot water.      What can you do to prevent this rash?  Avoid the plants.  Learn how to identify poison ivy, poison oak and poison sumac in all seasons.  When hiking or engaging in other activities that might expose you to these plants, try to stay on cleared pathways.  If camping, make sure you pitch your tent in an area free of these plants.  Keep pets from running through wooded areas so that urushiol doesn't accidentally stick to their fur, which you may touch.  Remove or kill the plants.  In your yard, you can get rid of poison ivy by applying an herbicide or pulling it out of the ground, including the roots, while wearing heavy gloves.  Afterward remove the gloves and thoroughly wash them and your hands.  Don't burn poison ivy or related plants because the urushiol can be carried by smoke.  Wear protective clothing.  If needed, protect your skin by wearing socks, boots, pants, long sleeves and vinyl gloves.  Wash your skin right away.  Washing off the oil with  soap and water within 30 minutes of exposure may reduce your chances of getting a poison ivy rash.  Even washing after an hour or so can help reduce the severity of the rash.  If you walk through some poison ivy and then later touch your shoes, you may get some urushiol on your hands, which may then transfer to your face or body by touching or rubbing.  If the contaminated object isn't cleaned, the urushiol on it can still cause a skin reaction years later.    Be careful not to reuse towels after you have washed your skin.  Also carefully wash clothing in detergent and hot water to remove all traces of the oil.  Handle contaminated clothing carefully so you don't transfer the urushiol to yourself, furniture, rugs or  appliances.  Remember that pets can carry the oil on their fur and paws.  If you think your pet may be contaminated with urushiol, put on some long rubber gloves and give your pet a bath.  Finally, be careful not to burn these plants as the smoke can contain traces of the oil.  Inhaling the smoke may result in difficulty breathing. If that occurred you should see a physician as soon as possible.  See your doctor right away if:   The reaction is severe or widespread  You inhaled the smoke from burning poison ivy and are having difficulty breathing  Your skin continues to swell  The rash affects your eyes, mouth or genitals  Blisters are oozing pus  You develop a fever greater than 100 F (37.8 C)  The rash doesn't get better within a few weeks.  If you scratch the poison ivy rash, bacteria under your fingernails may cause the skin to become infected.  See your doctor if pus starts oozing from the blisters.  Treatment generally includes antibiotics.  Poison ivy treatments are usually limited to self-care methods.  And the rash typically goes away on its own in two to three weeks.     If the rash is widespread or results in a large number of blisters, your doctor may prescribe an oral corticosteroid, such as prednisone.  If a bacterial infection has developed at the rash site, your doctor may give you a prescription for an oral antibiotic.  MAKE SURE YOU   Understand these instructions.  Will watch your condition.  Will get help right away if you are not doing well or get worse.  Thank you for choosing an e-visit. Your e-visit answers were reviewed by a board certified advanced clinical practitioner to complete your personal care plan. Depending upon the condition, your plan could have included both over the counter or prescription medications.  Please review your pharmacy choice. If there is a problem you may use MyChart messaging to have the prescription routed to another  pharmacy.   Your safety is important to [place]. If you have drug allergies check your prescription carefully.  You can use MyChart to ask questions about today's visit, request a non-urgent call back, or ask for a work or school excuse for 24 hours related to this e-Visit. If it has been greater than 24 hours you will need to follow up with your provider, or enter a new e-Visit to address those concerns.   You will get an email in the next two days asking about your experience. I hope that your e-visit has been valuable and will speed your recovery Thank you for choosing an e-visit.

## 2016-03-08 ENCOUNTER — Encounter: Payer: Self-pay | Admitting: Internal Medicine

## 2016-03-08 ENCOUNTER — Ambulatory Visit (INDEPENDENT_AMBULATORY_CARE_PROVIDER_SITE_OTHER): Payer: BLUE CROSS/BLUE SHIELD | Admitting: Internal Medicine

## 2016-03-08 ENCOUNTER — Other Ambulatory Visit (INDEPENDENT_AMBULATORY_CARE_PROVIDER_SITE_OTHER): Payer: BLUE CROSS/BLUE SHIELD

## 2016-03-08 VITALS — BP 122/76 | HR 82 | Temp 98.1°F | Resp 20 | Wt 138.0 lb

## 2016-03-08 DIAGNOSIS — F411 Generalized anxiety disorder: Secondary | ICD-10-CM | POA: Diagnosis not present

## 2016-03-08 DIAGNOSIS — R1013 Epigastric pain: Secondary | ICD-10-CM

## 2016-03-08 DIAGNOSIS — F329 Major depressive disorder, single episode, unspecified: Secondary | ICD-10-CM

## 2016-03-08 DIAGNOSIS — F32A Depression, unspecified: Secondary | ICD-10-CM

## 2016-03-08 LAB — CBC WITH DIFFERENTIAL/PLATELET
BASOS ABS: 0 10*3/uL (ref 0.0–0.1)
Basophils Relative: 0.5 % (ref 0.0–3.0)
Eosinophils Absolute: 0.3 10*3/uL (ref 0.0–0.7)
Eosinophils Relative: 5.2 % — ABNORMAL HIGH (ref 0.0–5.0)
HCT: 40.8 % (ref 36.0–46.0)
Hemoglobin: 13.8 g/dL (ref 12.0–15.0)
LYMPHS ABS: 1.8 10*3/uL (ref 0.7–4.0)
Lymphocytes Relative: 30.5 % (ref 12.0–46.0)
MCHC: 33.7 g/dL (ref 30.0–36.0)
MCV: 89 fl (ref 78.0–100.0)
MONO ABS: 0.3 10*3/uL (ref 0.1–1.0)
Monocytes Relative: 5.4 % (ref 3.0–12.0)
NEUTROS PCT: 58.4 % (ref 43.0–77.0)
Neutro Abs: 3.5 10*3/uL (ref 1.4–7.7)
Platelets: 219 10*3/uL (ref 150.0–400.0)
RBC: 4.59 Mil/uL (ref 3.87–5.11)
RDW: 13 % (ref 11.5–15.5)
WBC: 5.9 10*3/uL (ref 4.0–10.5)

## 2016-03-08 LAB — BASIC METABOLIC PANEL
BUN: 13 mg/dL (ref 6–23)
CALCIUM: 9.1 mg/dL (ref 8.4–10.5)
CO2: 29 meq/L (ref 19–32)
Chloride: 105 mEq/L (ref 96–112)
Creatinine, Ser: 0.78 mg/dL (ref 0.40–1.20)
GFR: 85.01 mL/min (ref >60.00)
GLUCOSE: 100 mg/dL — AB (ref 70–99)
POTASSIUM: 4.4 meq/L (ref 3.5–5.1)
SODIUM: 138 meq/L (ref 135–145)

## 2016-03-08 LAB — HEPATIC FUNCTION PANEL
ALBUMIN: 4 g/dL (ref 3.5–5.2)
ALK PHOS: 44 U/L (ref 39–117)
ALT: 11 U/L (ref 0–35)
AST: 14 U/L (ref 0–37)
Bilirubin, Direct: 0.1 mg/dL (ref 0.0–0.3)
TOTAL PROTEIN: 6.9 g/dL (ref 6.0–8.3)
Total Bilirubin: 0.6 mg/dL (ref 0.2–1.2)

## 2016-03-08 LAB — H. PYLORI ANTIBODY, IGG: H Pylori IgG: NEGATIVE

## 2016-03-08 LAB — TSH: TSH: 0.73 u[IU]/mL (ref 0.35–4.50)

## 2016-03-08 MED ORDER — PREDNISONE 20 MG PO TABS: 20.0000 mg | ORAL_TABLET | Freq: Every day | ORAL | Status: DC

## 2016-03-08 NOTE — Patient Instructions (Signed)
OK to take the prednisone as discussed  OK to take the Zyrtec 10 mg daily as needed  Please continue all other medications as before, and refills have been done if requested.  Please have the pharmacy call with any other refills you may need.  You will be contacted regarding the referral for: Dr Amedeo Plenty  Please keep your appointments with your specialists as you may have planned  Please go to the LAB in the Basement (turn left off the elevator) for the tests to be done today  You will be contacted by phone if any changes need to be made immediately.  Otherwise, you will receive a letter about your results with an explanation, but please check with MyChart first.  Please remember to sign up for MyChart if you have not done so, as this will be important to you in the future with finding out test results, communicating by private email, and scheduling acute appointments online when needed.

## 2016-03-08 NOTE — Progress Notes (Signed)
Pre visit review using our clinic review tool, if applicable. No additional management support is needed unless otherwise documented below in the visit note. 

## 2016-03-08 NOTE — Progress Notes (Addendum)
Subjective:    Patient ID: [name], female    DOB: July 13, 1971, 45 y.o.   MRN: [id number]  HPI  Here with 5 wks mid upper abd pain, intermittent, for some reason only seems to occur on sundays.  Also assoc with hives, and allegra not helping as much this time as before  Has hx of hives related to hives, happened again recently; the prior time with was better with allegra which helped then, but not now.  So switched to zyrtec.  Last Sunday was the worst episode yet assoc with n/v and diarrhea.  Zyrtec not helped that day, but has taken every day since then. This very last Sunday was careful to take the zyrtec and overall pain somewhat less, and no vomiting or diarrhea.  Diarrhea described as more freq loose stools (not watery, no blood) .  Has also taken zantac 150 mg once a day, at one point for 1 wk but not sure if helped at all.  No new foods, Most recent wt has been stable Wt Readings from Last 3 Encounters:  03/08/16 138 lb (62.596 kg)  10/14/15 137 lb (62.143 kg)  10/08/14 144 lb (65.318 kg)  Appetite ok for the most part, hesitant to eat at all but does not make things better or worse. Eats some peanuts, and more nuts overall recently earlier this yr.  Has known mild shellfish allergy, proven by skin testing in past (as well as green pea and wheat).  No prior dx gluten sensitivity or celiac.  Does also remember a slight tightening of the throat for just a few minutes without sequalea a few months ago with a sample cookie butter at [place]. Pain overall mod except for severe wit one episode in last 2 wks.  May have some raidation to the back.  No fever.  Still has GB, no prior abd surguries.  No prior EGD, no prior colonoscopy, but plans to have colonoscopy at age 45 per GI (same office as Dr Teena Irani) but has not had yet. . Denies worsening depressive symptoms, suicidal ideation, or panic; has ongoing anxiety,stable per pt Past Medical History  Diagnosis Date  . ANXIETY 10/05/2009  .  ANXIETY DEPRESSION 10/25/2007  . DEPRESSION 10/05/2009  . MIGRAINE HEADACHE 10/25/2007  . ALLERGIC RHINITIS 10/25/2007  . GERD (gastroesophageal reflux disease)     occasional-tums prn   Past Surgical History  Procedure Laterality Date  . No past surgeries    . Laparoscopic tubal ligation  07/29/2011    Procedure: LAPAROSCOPIC TUBAL LIGATION;  Surgeon: Darlyn Chamber;  Location: [facility];  Service: Gynecology;  Laterality: Bilateral;  . Iud removal  07/29/2011    Procedure: INTRAUTERINE DEVICE (IUD) REMOVAL;  Surgeon: Darlyn Chamber;  Location: [facility];  Service: Gynecology;  Laterality: N/A;  . Tubal ligation      reports that she has never smoked. She has never used smokeless tobacco. She reports that she drinks alcohol. She reports that she does not use illicit drugs. family history includes Hypertension in her father. Allergies  Allergen Reactions  . Penicillins     Childhood reaction - rash    Current Outpatient Prescriptions on File Prior to Visit  Medication Sig Dispense Refill  . citalopram (CELEXA) 10 MG tablet TAKE 1 TABLET BY MOUTH EVERY DAY 90 tablet 2  . triamcinolone cream (KENALOG) 0.1 % Apply 1 application topically 2 (two) times daily. 30 g 0  . valACYclovir (VALTREX) 1000 MG tablet Take 1 tablet (  1,000 mg total) by mouth 2 (two) times daily. 14 tablet 0  . zolpidem (AMBIEN) 10 MG tablet Take 1 tablet (10 mg total) by mouth at bedtime as needed for sleep. 30 tablet 2   No current facility-administered medications on file prior to visit.   Review of Systems  Constitutional: Negative for unusual diaphoresis or night sweats HENT: Negative for ear swelling or discharge Eyes: Negative for worsening visual haziness  Respiratory: Negative for choking and stridor.   Gastrointestinal: Negative for distension or worsening eructation Genitourinary: Negative for retention or change in urine volume.  Musculoskeletal: Negative for other MSK pain or swelling Skin: Negative for color  change and worsening wound Neurological: Negative for tremors and numbness other than noted  Psychiatric/Behavioral: Negative for decreased concentration or agitation other than above       Objective:   Physical Exam BP 122/76 mmHg  Pulse 82  Temp(Src) 98.1 F (36.7 C) (Oral)  Resp 20  Wt 138 lb (62.596 kg)  SpO2 97% VS noted,  Constitutional: Pt appears in no apparent distress HENT: Head: NCAT.  Right Ear: External ear normal.  Left Ear: External ear normal.  Eyes: . Pupils are equal, round, and reactive to light. Conjunctivae and EOM are normal Neck: Normal range of motion. Neck supple.  Cardiovascular: Normal rate and regular rhythm.   Pulmonary/Chest: Effort normal and breath sounds without rales or wheezing.  Abd:  Soft, NT, ND, + BS - benign Neurological: Pt is alert. Not confused , motor grossly intact Skin: Skin is warm. No rash, no LE edema Psychiatric: Pt behavior is normal. No agitation. mild nervous, not depressed affect   Lab Results  Component Value Date   WBC 6.1 10/14/2015   HGB 14.3 10/14/2015   HCT 41.5 10/14/2015   PLT 205.0 10/14/2015   GLUCOSE 101* 10/14/2015   CHOL 213* 10/14/2015   TRIG 47.0 10/14/2015   HDL 78.10 10/14/2015   LDLDIRECT 101.3 03/08/2012   LDLCALC 126* 10/14/2015   ALT 10 10/14/2015   AST 14 10/14/2015   NA 140 10/14/2015   K 4.5 10/14/2015   CL 107 10/14/2015   CREATININE 0.78 10/14/2015   BUN 17 10/14/2015   CO2 26 10/14/2015   TSH 1.40 10/14/2015       Assessment & Plan:

## 2016-03-09 LAB — RETICULIN ANTIBODIES, IGA W TITER: Reticulin Ab, IgA: NEGATIVE

## 2016-03-09 LAB — TISSUE TRANSGLUTAMINASE, IGA: TISSUE TRANSGLUTAMINASE AB, IGA: 1 U/mL (ref <4)

## 2016-03-09 LAB — GLIADIN ANTIBODIES, SERUM
GLIADIN IGA: 9 U (ref <20)
Gliadin IgG: 3 Units (ref <20)

## 2016-03-15 NOTE — Assessment & Plan Note (Signed)
Chronic mild persistent, declines need for further evaluation or tx or counseling

## 2016-03-15 NOTE — Assessment & Plan Note (Addendum)
stable overall by history and exam, recent data reviewed with pt, and pt to continue medical treatment as before,  to f/u any worsening symptoms or concerns  

## 2016-03-15 NOTE — Assessment & Plan Note (Signed)
Etiology unclear, cant r/o functional vs allergic related given hx, for food allergen avoidance, cont all other tx and refer GI/Dr Teena Irani per pt request - ? Need EGD, also for labs including h pylori, celiac panel

## 2016-05-10 ENCOUNTER — Other Ambulatory Visit: Payer: Self-pay | Admitting: Internal Medicine

## 2016-05-10 MED ORDER — VALACYCLOVIR HCL 1 G PO TABS: 1000.0000 mg | ORAL_TABLET | Freq: Two times a day (BID) | ORAL | 5 refills | Status: DC

## 2016-05-10 NOTE — Telephone Encounter (Signed)
Done erx 

## 2016-07-04 ENCOUNTER — Other Ambulatory Visit: Payer: Self-pay | Admitting: Internal Medicine

## 2016-07-04 NOTE — Telephone Encounter (Signed)
faxed

## 2016-07-04 NOTE — Telephone Encounter (Signed)
Done hardcopy to Corinne  

## 2016-07-06 ENCOUNTER — Telehealth: Payer: Self-pay | Admitting: Emergency Medicine

## 2016-07-06 NOTE — Telephone Encounter (Signed)
This has been done.

## 2016-07-06 NOTE — Telephone Encounter (Signed)
Pharmacy called and wants to know if they can get a prescription for zolpidem (AMBIEN) 10 MG tablet. The one that was sent to [facility] can be cancelled. Please advise thanks.

## 2016-10-09 ENCOUNTER — Other Ambulatory Visit: Payer: Self-pay | Admitting: Internal Medicine

## 2016-11-15 ENCOUNTER — Other Ambulatory Visit: Payer: Self-pay | Admitting: Internal Medicine

## 2016-11-15 NOTE — Telephone Encounter (Signed)
Done hardcopy to Shirron  

## 2016-11-17 ENCOUNTER — Encounter: Payer: Self-pay | Admitting: Internal Medicine

## 2016-11-17 ENCOUNTER — Other Ambulatory Visit (INDEPENDENT_AMBULATORY_CARE_PROVIDER_SITE_OTHER): Payer: BLUE CROSS/BLUE SHIELD

## 2016-11-17 ENCOUNTER — Ambulatory Visit (INDEPENDENT_AMBULATORY_CARE_PROVIDER_SITE_OTHER): Payer: BLUE CROSS/BLUE SHIELD | Admitting: Internal Medicine

## 2016-11-17 VITALS — BP 104/76 | Temp 98.4°F | Ht 68.0 in | Wt 142.0 lb

## 2016-11-17 DIAGNOSIS — Z0000 Encounter for general adult medical examination without abnormal findings: Secondary | ICD-10-CM

## 2016-11-17 DIAGNOSIS — R1013 Epigastric pain: Secondary | ICD-10-CM | POA: Diagnosis not present

## 2016-11-17 DIAGNOSIS — L259 Unspecified contact dermatitis, unspecified cause: Secondary | ICD-10-CM

## 2016-11-17 LAB — BASIC METABOLIC PANEL
BUN: 18 mg/dL (ref 6–23)
CALCIUM: 9 mg/dL (ref 8.4–10.5)
CHLORIDE: 107 meq/L (ref 96–112)
CO2: 26 mEq/L (ref 19–32)
CREATININE: 0.77 mg/dL (ref 0.40–1.20)
GFR: 86.02 mL/min (ref >60.00)
GLUCOSE: 89 mg/dL (ref 70–99)
POTASSIUM: 4.1 meq/L (ref 3.5–5.1)
Sodium: 139 mEq/L (ref 135–145)

## 2016-11-17 LAB — CBC WITH DIFFERENTIAL/PLATELET
Basophils Absolute: 0 10*3/uL (ref 0.0–0.1)
Basophils Relative: 1.2 % (ref 0.0–3.0)
EOS PCT: 5.1 % — AB (ref 0.0–5.0)
Eosinophils Absolute: 0.2 10*3/uL (ref 0.0–0.7)
HEMATOCRIT: 38.6 % (ref 36.0–46.0)
Hemoglobin: 13.1 g/dL (ref 12.0–15.0)
LYMPHS PCT: 40.3 % (ref 12.0–46.0)
Lymphs Abs: 1.6 10*3/uL (ref 0.7–4.0)
MCHC: 34 g/dL (ref 30.0–36.0)
MCV: 88.1 fl (ref 78.0–100.0)
MONOS PCT: 5.5 % (ref 3.0–12.0)
Monocytes Absolute: 0.2 10*3/uL (ref 0.1–1.0)
Neutro Abs: 1.9 10*3/uL (ref 1.4–7.7)
Neutrophils Relative %: 47.9 % (ref 43.0–77.0)
Platelets: 212 10*3/uL (ref 150.0–400.0)
RBC: 4.38 Mil/uL (ref 3.87–5.11)
RDW: 12.7 % (ref 11.5–15.5)
WBC: 4 10*3/uL (ref 4.0–10.5)

## 2016-11-17 LAB — URINALYSIS, ROUTINE W REFLEX MICROSCOPIC
Bilirubin Urine: NEGATIVE
KETONES UR: NEGATIVE
LEUKOCYTES UA: NEGATIVE
NITRITE: NEGATIVE
Specific Gravity, Urine: 1.025 (ref 1.000–1.030)
Total Protein, Urine: NEGATIVE
URINE GLUCOSE: NEGATIVE
UROBILINOGEN UA: 0.2 (ref 0.0–1.0)
pH: 6 (ref 5.0–8.0)

## 2016-11-17 LAB — LIPID PANEL
Cholesterol: 204 mg/dL — ABNORMAL HIGH (ref 0–200)
HDL: 83.9 mg/dL (ref >39.00)
LDL CALC: 113 mg/dL — AB (ref 0–99)
NonHDL: 120.23
Total CHOL/HDL Ratio: 2
Triglycerides: 36 mg/dL (ref 0.0–149.0)
VLDL: 7.2 mg/dL (ref 0.0–40.0)

## 2016-11-17 LAB — TSH: TSH: 0.9 u[IU]/mL (ref 0.35–4.50)

## 2016-11-17 LAB — HEPATIC FUNCTION PANEL
ALBUMIN: 4.1 g/dL (ref 3.5–5.2)
ALK PHOS: 35 U/L — AB (ref 39–117)
ALT: 11 U/L (ref 0–35)
AST: 16 U/L (ref 0–37)
Bilirubin, Direct: 0.1 mg/dL (ref 0.0–0.3)
Total Bilirubin: 0.6 mg/dL (ref 0.2–1.2)
Total Protein: 6.7 g/dL (ref 6.0–8.3)

## 2016-11-17 MED ORDER — TRIAMCINOLONE ACETONIDE 0.1 % EX CREA: 1.0000 "application " | TOPICAL_CREAM | Freq: Two times a day (BID) | CUTANEOUS | 0 refills | Status: DC

## 2016-11-17 NOTE — Assessment & Plan Note (Signed)
Last eval last yr negative, seems interesting in that allergy med for allergic rhinitis seems to help the abd pain as well, now fairly well controlled, consider Gi referral in future if worse

## 2016-11-17 NOTE — Assessment & Plan Note (Signed)

## 2016-11-17 NOTE — Progress Notes (Signed)
Subjective:    Patient ID: [name], female    DOB: [birth year], 46 y.o.   MRN: [id number]  HPI   Here for wellness and f/u;  Overall doing ok;  Pt denies Chest pain, worsening SOB, DOE, wheezing, orthopnea, PND, worsening LE edema, palpitations, dizziness or syncope.  Pt denies neurological change such as new headache, facial or extremity weakness.  Pt denies polydipsia, polyuria, or low sugar symptoms. Pt states overall good compliance with treatment and medications, good tolerability, and has been trying to follow appropriate diet.  Pt denies worsening depressive symptoms, suicidal ideation or panic. No fever, night sweats, wt loss, loss of appetite, or other constitutional symptoms.  Pt states good ability with ADL's, has low fall risk, home safety reviewed and adequate, no other significant changes in hearing or vision, and only occasionally active with exercise in last 5 months, has gained a few lbs, and plans to start back to gym more regularly. Has seen chiropracter with good results with neck pain.  GI symptoms improved in severity in freq and severity and seems to be better with allergy med use daily.  Does have recent onset right wrist contact dermatitis after working in the yard/.   Wt Readings from Last 3 Encounters:  11/17/16 142 lb (64.4 kg)  03/08/16 138 lb (62.6 kg)  10/14/15 137 lb (62.1 kg)   Past Medical History:  Diagnosis Date  . ALLERGIC RHINITIS 10/25/2007  . ANXIETY 10/05/2009  . ANXIETY DEPRESSION 10/25/2007  . DEPRESSION 10/05/2009  . GERD (gastroesophageal reflux disease)    occasional-tums prn  . MIGRAINE HEADACHE 10/25/2007   Past Surgical History:  Procedure Laterality Date  . IUD REMOVAL  07/29/2011   Procedure: INTRAUTERINE DEVICE (IUD) REMOVAL;  Surgeon: Darlyn Chamber;  Location: [facility];  Service: Gynecology;  Laterality: N/A;  . LAPAROSCOPIC TUBAL LIGATION  07/29/2011   Procedure: LAPAROSCOPIC TUBAL LIGATION;  Surgeon: Darlyn Chamber;  Location: [facility];   Service: Gynecology;  Laterality: Bilateral;  . NO PAST SURGERIES    . TUBAL LIGATION      reports that she has never smoked. She has never used smokeless tobacco. She reports that she drinks alcohol. She reports that she does not use drugs. family history includes Hypertension in her father. Allergies  Allergen Reactions  . Penicillins     Childhood reaction - rash    Current Outpatient Prescriptions on File Prior to Visit  Medication Sig Dispense Refill  . citalopram (CELEXA) 10 MG tablet TAKE 1 TABLET BY MOUTH EVERY DAY (Patient not taking: Reported on 11/17/2016) 90 tablet 0  . triamcinolone cream (KENALOG) 0.1 % Apply 1 application topically 2 (two) times daily. 30 g 0  . valACYclovir (VALTREX) 1000 MG tablet Take 1 tablet (1,000 mg total) by mouth 2 (two) times daily. 14 tablet 5  . zolpidem (AMBIEN) 10 MG tablet TAKE 1 TABLET BY MOUTH AT BEDTIME AS NEEDED FOR SLEEP 90 tablet 0   No current facility-administered medications on file prior to visit.    Review of Systems Constitutional: Negative for increased diaphoresis, or other activity, appetite or siginficant weight change other than noted HENT: Negative for worsening hearing loss, ear pain, facial swelling, mouth sores and neck stiffness.   Eyes: Negative for other worsening pain, redness or visual disturbance.  Respiratory: Negative for choking or stridor Cardiovascular: Negative for other chest pain and palpitations.  Gastrointestinal: Negative for worsening diarrhea, blood in stool, or abdominal distention Genitourinary: Negative for hematuria, flank pain or  change in urine volume.  Musculoskeletal: Negative for myalgias or other joint complaints.  Skin: Negative for other color change and wound or drainage.  Neurological: Negative for syncope and numbness. other than noted Hematological: Negative for adenopathy. or other swelling Psychiatric/Behavioral: Negative for hallucinations, SI, self-injury, decreased concentration  or other worsening agitation.  All other system neg per pt    Objective:   Physical Exam BP 104/76   Temp 98.4 F (36.9 C) (Oral)   Ht 5' 8"  (1.727 m)   Wt 142 lb (64.4 kg)   LMP 11/13/2016   SpO2 98%   BMI 21.59 kg/m  VS noted,  Constitutional: Pt is oriented to person, place, and time. Appears well-developed and well-nourished, in no significant distress Head: Normocephalic and atraumatic  Eyes: Conjunctivae and EOM are normal. Pupils are equal, round, and reactive to light Right Ear: External ear normal.  Left Ear: External ear normal Nose: Nose normal.  Mouth/Throat: Oropharynx is clear and moist  Neck: Normal range of motion. Neck supple. No JVD present. No tracheal deviation present or significant neck LA or mass Cardiovascular: Normal rate, regular rhythm, normal heart sounds and intact distal pulses.   Pulmonary/Chest: Effort normal and breath sounds without rales or wheezing  Abdominal: Soft. Bowel sounds are normal. NT. No HSM  Musculoskeletal: Normal range of motion. Exhibits no edema Lymphadenopathy: Has no cervical adenopathy.  Neurological: Pt is alert and oriented to person, place, and time. Pt has normal reflexes. No cranial nerve deficit. Motor grossly intact Skin: Skin is warm and dry. + contact derm type rash noted to right volar wrist about 2 cm area, no new ulcers Psychiatric:  Has normal mood and affect. Behavior is normal.  No other exam findings  ECG I have interpreted today: Sinus  Rhythm  - WNL  Reviewed: Transthoracic Echocardiograph Study Date: 01/05/2011 Study Conclusions Left ventricle: The cavity size was normal. Systolic function was normal. The estimated ejection fraction was in the range of 55% to 65%. Wall motion was normal; there were no regional wall motion abnormalities.  Lab Results  Component Value Date   WBC 5.9 03/08/2016   HGB 13.8 03/08/2016   HCT 40.8 03/08/2016   PLT 219.0 03/08/2016   GLUCOSE 100 (H) 03/08/2016   CHOL  213 (H) 10/14/2015   TRIG 47.0 10/14/2015   HDL 78.10 10/14/2015   LDLDIRECT 101.3 03/08/2012   LDLCALC 126 (H) 10/14/2015   ALT 11 03/08/2016   AST 14 03/08/2016   NA 138 03/08/2016   K 4.4 03/08/2016   CL 105 03/08/2016   CREATININE 0.78 03/08/2016   BUN 13 03/08/2016   CO2 29 03/08/2016   TSH 0.73 03/08/2016      Assessment & Plan:

## 2016-11-17 NOTE — Patient Instructions (Signed)
Your EKG was OK today  Please take all new medication as prescribed - the steroid cream  Please continue all other medications as before, and refills have been done if requested.  Please have the pharmacy call with any other refills you may need.  Please continue your efforts at being more active, low cholesterol diet, and weight control.  You are otherwise up to date with prevention measures today.  Please keep your appointments with your specialists as you may have planned  Please go to the LAB in the Basement (turn left off the elevator) for the tests to be done today  You will be contacted by phone if any changes need to be made immediately.  Otherwise, you will receive a letter about your results with an explanation, but please check with MyChart first.  Please remember to sign up for MyChart if you have not done so, as this will be important to you in the future with finding out test results, communicating by private email, and scheduling acute appointments online when needed.  Please return in 1 year for your yearly visit, or sooner if needed, with Lab testing done 3-5 days before

## 2016-11-17 NOTE — Assessment & Plan Note (Signed)
[place] for topical steroid cream prn,  to f/u any worsening symptoms or concerns

## 2016-11-17 NOTE — Progress Notes (Signed)
Pre visit review using our clinic review tool, if applicable. No additional management support is needed unless otherwise documented below in the visit note. 

## 2016-11-18 LAB — HIV ANTIBODY (ROUTINE TESTING W REFLEX): HIV: NONREACTIVE

## 2017-01-07 ENCOUNTER — Other Ambulatory Visit: Payer: Self-pay | Admitting: Internal Medicine

## 2017-04-12 ENCOUNTER — Other Ambulatory Visit: Payer: Self-pay | Admitting: Internal Medicine

## 2017-04-12 NOTE — Telephone Encounter (Signed)
celexa done erx  Ambien, Done hardcopy to [employer]

## 2017-04-12 NOTE — Telephone Encounter (Signed)
faxed

## 2017-11-08 ENCOUNTER — Other Ambulatory Visit: Payer: Self-pay | Admitting: Internal Medicine

## 2017-11-09 MED ORDER — VALACYCLOVIR HCL 1 G PO TABS: 1000.0000 mg | ORAL_TABLET | Freq: Two times a day (BID) | ORAL | 0 refills | Status: DC

## 2017-11-09 NOTE — Addendum Note (Signed)
Addended by: Earnstine Regal on: 11/09/2017 11:20 AM   Modules accepted: Orders

## 2017-11-09 NOTE — Telephone Encounter (Signed)
Pt requesting Valtrex refill, enough until next appt on 4//5/19.. States blister on lip.  Refill denied yesterday. [facility] [address]  Pt's # [phone]

## 2017-11-09 NOTE — Telephone Encounter (Signed)
Patient would like to know why the valACYclovir (VALTREX) 1000 MG tablet was denied as she is not over due for a CPE. Patient is sch for CPE 12/01/17. She would like a refill until her next appt. Has blister on lip  [facility] on [address].   She wants a call back she said [phone]

## 2017-11-09 NOTE — Telephone Encounter (Signed)
Per office policy sent 30 day to local pharmacy until appt...[name]

## 2017-11-09 NOTE — Telephone Encounter (Signed)
Appt made, please advise

## 2017-12-01 ENCOUNTER — Other Ambulatory Visit (INDEPENDENT_AMBULATORY_CARE_PROVIDER_SITE_OTHER): Payer: BLUE CROSS/BLUE SHIELD

## 2017-12-01 ENCOUNTER — Ambulatory Visit (INDEPENDENT_AMBULATORY_CARE_PROVIDER_SITE_OTHER): Payer: BLUE CROSS/BLUE SHIELD | Admitting: Internal Medicine

## 2017-12-01 ENCOUNTER — Encounter: Payer: Self-pay | Admitting: Internal Medicine

## 2017-12-01 VITALS — BP 102/68 | HR 86 | Temp 98.4°F | Ht 68.0 in | Wt 143.0 lb

## 2017-12-01 DIAGNOSIS — Z0000 Encounter for general adult medical examination without abnormal findings: Secondary | ICD-10-CM

## 2017-12-01 DIAGNOSIS — Z23 Encounter for immunization: Secondary | ICD-10-CM

## 2017-12-01 LAB — URINALYSIS, ROUTINE W REFLEX MICROSCOPIC
BILIRUBIN URINE: NEGATIVE
Hgb urine dipstick: NEGATIVE
KETONES UR: NEGATIVE
NITRITE: NEGATIVE
PH: 5.5 (ref 5.0–8.0)
RBC / HPF: NONE SEEN (ref 0–?)
TOTAL PROTEIN, URINE-UPE24: NEGATIVE
Urine Glucose: NEGATIVE
Urobilinogen, UA: 0.2 (ref 0.0–1.0)

## 2017-12-01 LAB — HEPATIC FUNCTION PANEL
ALT: 10 U/L (ref 0–35)
AST: 12 U/L (ref 0–37)
Albumin: 3.9 g/dL (ref 3.5–5.2)
Alkaline Phosphatase: 36 U/L — ABNORMAL LOW (ref 39–117)
BILIRUBIN DIRECT: 0.1 mg/dL (ref 0.0–0.3)
TOTAL PROTEIN: 6.4 g/dL (ref 6.0–8.3)
Total Bilirubin: 0.5 mg/dL (ref 0.2–1.2)

## 2017-12-01 LAB — CBC WITH DIFFERENTIAL/PLATELET
BASOS PCT: 0.9 % (ref 0.0–3.0)
Basophils Absolute: 0 10*3/uL (ref 0.0–0.1)
EOS ABS: 0.1 10*3/uL (ref 0.0–0.7)
EOS PCT: 2.7 % (ref 0.0–5.0)
HEMATOCRIT: 37.8 % (ref 36.0–46.0)
HEMOGLOBIN: 13.1 g/dL (ref 12.0–15.0)
LYMPHS PCT: 32 % (ref 12.0–46.0)
Lymphs Abs: 1.6 10*3/uL (ref 0.7–4.0)
MCHC: 34.7 g/dL (ref 30.0–36.0)
MCV: 88.5 fl (ref 78.0–100.0)
Monocytes Absolute: 0.3 10*3/uL (ref 0.1–1.0)
Monocytes Relative: 6.3 % (ref 3.0–12.0)
NEUTROS ABS: 3 10*3/uL (ref 1.4–7.7)
Neutrophils Relative %: 58.1 % (ref 43.0–77.0)
PLATELETS: 179 10*3/uL (ref 150.0–400.0)
RBC: 4.27 Mil/uL (ref 3.87–5.11)
RDW: 13.3 % (ref 11.5–15.5)
WBC: 5.1 10*3/uL (ref 4.0–10.5)

## 2017-12-01 LAB — BASIC METABOLIC PANEL
BUN: 21 mg/dL (ref 6–23)
CHLORIDE: 109 meq/L (ref 96–112)
CO2: 25 mEq/L (ref 19–32)
Calcium: 8.7 mg/dL (ref 8.4–10.5)
Creatinine, Ser: 0.82 mg/dL (ref 0.40–1.20)
GFR: 79.63 mL/min (ref >60.00)
Glucose, Bld: 94 mg/dL (ref 70–99)
POTASSIUM: 4.1 meq/L (ref 3.5–5.1)
SODIUM: 141 meq/L (ref 135–145)

## 2017-12-01 LAB — TSH: TSH: 1.11 u[IU]/mL (ref 0.35–4.50)

## 2017-12-01 LAB — LIPID PANEL
CHOLESTEROL: 189 mg/dL (ref 0–200)
HDL: 77.6 mg/dL (ref >39.00)
LDL CALC: 104 mg/dL — AB (ref 0–99)
NonHDL: 111.53
Total CHOL/HDL Ratio: 2
Triglycerides: 39 mg/dL (ref 0.0–149.0)
VLDL: 7.8 mg/dL (ref 0.0–40.0)

## 2017-12-01 NOTE — Patient Instructions (Signed)
You had the Tdap tetanus shot today  Please continue all other medications as before, and refills have been done if requested.  Please have the pharmacy call with any other refills you may need.  Please continue your efforts at being more active, low cholesterol diet, and weight control.  You are otherwise up to date with prevention measures today.  Please keep your appointments with your specialists as you may have planned Please go to the LAB in the Basement (turn left off the elevator) for the tests to be done today  You will be contacted by phone if any changes need to be made immediately.  Otherwise, you will receive a letter about your results with an explanation, but please check with MyChart first.  Please remember to sign up for MyChart if you have not done so, as this will be important to you in the future with finding out test results, communicating by private email, and scheduling acute appointments online when needed.  Please return in 1 year for your yearly visit, or sooner if needed, with Lab testing done 3-5 days before

## 2017-12-01 NOTE — Progress Notes (Signed)
Subjective:    Patient ID: [name], female    DOB: 05-Nov-1970, 47 y.o.   MRN: [id number]  HPI   Here for wellness and f/u;  Overall doing ok;  Pt denies Chest pain, worsening SOB, DOE, wheezing, orthopnea, PND, worsening LE edema, palpitations, dizziness or syncope.  Pt denies neurological change such as new headache, facial or extremity weakness.  Pt denies polydipsia, polyuria, or low sugar symptoms. Pt states overall good compliance with treatment and medications, good tolerability, and has been trying to follow appropriate diet.  Pt denies worsening depressive symptoms, suicidal ideation or panic. No fever, night sweats, wt loss, loss of appetite, or other constitutional symptoms.  Pt states good ability with ADL's, has low fall risk, home safety reviewed and adequate, no other significant changes in hearing or vision, and only occasionally active with exercise.  Due for Tdap, No interval hx or new complaint Past Medical History:  Diagnosis Date  . ALLERGIC RHINITIS 10/25/2007  . ANXIETY 10/05/2009  . ANXIETY DEPRESSION 10/25/2007  . DEPRESSION 10/05/2009  . GERD (gastroesophageal reflux disease)    occasional-tums prn  . MIGRAINE HEADACHE 10/25/2007   Past Surgical History:  Procedure Laterality Date  . IUD REMOVAL  07/29/2011   Procedure: INTRAUTERINE DEVICE (IUD) REMOVAL;  Surgeon: Darlyn Chamber;  Location: [facility];  Service: Gynecology;  Laterality: N/A;  . LAPAROSCOPIC TUBAL LIGATION  07/29/2011   Procedure: LAPAROSCOPIC TUBAL LIGATION;  Surgeon: Darlyn Chamber;  Location: [facility];  Service: Gynecology;  Laterality: Bilateral;  . NO PAST SURGERIES    . TUBAL LIGATION      reports that she has never smoked. She has never used smokeless tobacco. She reports that she drinks alcohol. She reports that she does not use drugs. family history includes Hypertension in her father and unknown relative. Allergies  Allergen Reactions  . Penicillins     Childhood reaction - rash    Current  Outpatient Medications on File Prior to Visit  Medication Sig Dispense Refill  . citalopram (CELEXA) 10 MG tablet TAKE 1 TABLET BY MOUTH EVERY DAY 90 tablet 2  . triamcinolone cream (KENALOG) 0.1 % Apply 1 application topically 2 (two) times daily. 30 g 0  . valACYclovir (VALTREX) 1000 MG tablet Take 1 tablet (1,000 mg total) by mouth 2 (two) times daily. Must keep scheduled appt for future refills 60 tablet 0  . zolpidem (AMBIEN) 10 MG tablet TAKE 1 TABLET BY MOUTH EVERY NIGHT AT BEDTIME AS NEEDED FOR SLEEP. 30 tablet 5   No current facility-administered medications on file prior to visit.    Review of Systems Constitutional: Negative for other unusual diaphoresis, sweats, appetite or weight changes HENT: Negative for other worsening hearing loss, ear pain, facial swelling, mouth sores or neck stiffness.   Eyes: Negative for other worsening pain, redness or other visual disturbance.  Respiratory: Negative for other stridor or swelling Cardiovascular: Negative for other palpitations or other chest pain  Gastrointestinal: Negative for worsening diarrhea or loose stools, blood in stool, distention or other pain Genitourinary: Negative for hematuria, flank pain or other change in urine volume.  Musculoskeletal: Negative for myalgias or other joint swelling.  Skin: Negative for other color change, or other wound or worsening drainage.  Neurological: Negative for other syncope or numbness. Hematological: Negative for other adenopathy or swelling Psychiatric/Behavioral: Negative for hallucinations, other worsening agitation, SI, self-injury, or new decreased concentration All other system neg per pt    Objective:   Physical Exam BP  102/68   Pulse 86   Temp 98.4 F (36.9 C) (Oral)   Ht 5' 8"  (1.727 m)   Wt 143 lb (64.9 kg)   SpO2 98%   BMI 21.74 kg/m  VS noted,  Constitutional: Pt is oriented to person, place, and time. Appears well-developed and well-nourished, in no significant  distress and comfortable Head: Normocephalic and atraumatic  Eyes: Conjunctivae and EOM are normal. Pupils are equal, round, and reactive to light Right Ear: External ear normal without discharge Left Ear: External ear normal without discharge Nose: Nose without discharge or deformity Mouth/Throat: Oropharynx is without other ulcerations and moist  Neck: Normal range of motion. Neck supple. No JVD present. No tracheal deviation present or significant neck LA or mass Cardiovascular: Normal rate, regular rhythm, normal heart sounds and intact distal pulses.   Pulmonary/Chest: WOB normal and breath sounds without rales or wheezing  Abdominal: Soft. Bowel sounds are normal. NT. No HSM  Musculoskeletal: Normal range of motion. Exhibits no edema Lymphadenopathy: Has no other cervical adenopathy.  Neurological: Pt is alert and oriented to person, place, and time. Pt has normal reflexes. No cranial nerve deficit. Motor grossly intact, Gait intact Skin: Skin is warm and dry. No rash noted or new ulcerations Psychiatric:  Has normal mood and affect. Behavior is normal without agitation No other exam findings    Assessment & Plan:

## 2017-12-02 ENCOUNTER — Encounter: Payer: Self-pay | Admitting: Internal Medicine

## 2017-12-02 NOTE — Assessment & Plan Note (Signed)

## 2018-01-20 ENCOUNTER — Other Ambulatory Visit: Payer: Self-pay | Admitting: Internal Medicine

## 2018-03-27 ENCOUNTER — Other Ambulatory Visit: Payer: Self-pay | Admitting: Internal Medicine

## 2018-03-27 NOTE — Telephone Encounter (Signed)
Done erx 

## 2018-12-04 ENCOUNTER — Encounter: Payer: BLUE CROSS/BLUE SHIELD | Admitting: Internal Medicine

## 2018-12-28 ENCOUNTER — Telehealth: Payer: Self-pay | Admitting: Internal Medicine

## 2018-12-28 MED ORDER — CITALOPRAM HYDROBROMIDE 10 MG PO TABS: 10.0000 mg | ORAL_TABLET | Freq: Every day | ORAL | 0 refills | Status: DC

## 2018-12-28 MED ORDER — ZOLPIDEM TARTRATE 10 MG PO TABS: ORAL_TABLET | ORAL | 0 refills | Status: DC

## 2018-12-28 NOTE — Telephone Encounter (Signed)
Copied from [place]  [id number]. Topic: Quick Communication - Rx Refill/Question >> Dec 28, 2018 10:57 AM Scherrie Gerlach wrote: Medication:  citalopram (CELEXA) 10 MG tablet 90 day zolpidem (AMBIEN) 10 MG tablet 30 Pt states her insurance has changed and she must see a Corporate treasurer.  She has appt with her new dr in July and would like to know if dr will refills these 2 meds until she sees her new dr. [facility] [phone] - [place], [place] - [address] [place] [phone] (Phone) [phone] (Fax)

## 2018-12-28 NOTE — Addendum Note (Signed)
Addended by: Biagio Borg on: 12/28/2018 12:20 PM   Modules accepted: Orders

## 2018-12-28 NOTE — Telephone Encounter (Signed)
Done erx 

## 2020-05-06 ENCOUNTER — Ambulatory Visit (INDEPENDENT_AMBULATORY_CARE_PROVIDER_SITE_OTHER): Payer: 59 | Admitting: Internal Medicine

## 2020-05-06 ENCOUNTER — Encounter: Payer: Self-pay | Admitting: Internal Medicine

## 2020-05-06 ENCOUNTER — Other Ambulatory Visit: Payer: Self-pay

## 2020-05-06 VITALS — BP 118/80 | HR 78 | Temp 98.7°F | Ht 68.0 in | Wt 152.0 lb

## 2020-05-06 DIAGNOSIS — E559 Vitamin D deficiency, unspecified: Secondary | ICD-10-CM | POA: Diagnosis not present

## 2020-05-06 DIAGNOSIS — E538 Deficiency of other specified B group vitamins: Secondary | ICD-10-CM

## 2020-05-06 DIAGNOSIS — Z0000 Encounter for general adult medical examination without abnormal findings: Secondary | ICD-10-CM | POA: Diagnosis not present

## 2020-05-06 MED ORDER — CITALOPRAM HYDROBROMIDE 10 MG PO TABS: 10.0000 mg | ORAL_TABLET | Freq: Every day | ORAL | 3 refills | Status: DC

## 2020-05-06 MED ORDER — VALACYCLOVIR HCL 1 G PO TABS: 1000.0000 mg | ORAL_TABLET | Freq: Two times a day (BID) | ORAL | 2 refills | Status: DC

## 2020-05-06 NOTE — Patient Instructions (Signed)
Please continue all other medications as before, and refills have been done if requested.  Please have the pharmacy call with any other refills you may need.  Please continue your efforts at being more active, low cholesterol diet, and weight control.  You are otherwise up to date with prevention measures today.  Please keep your appointments with your specialists as you may have planned  Please go to the LAB at the blood drawing area for the tests to be done  You will be contacted by phone if any changes need to be made immediately.  Otherwise, you will receive a letter about your results with an explanation, but please check with MyChart first.  Please remember to sign up for MyChart if you have not done so, as this will be important to you in the future with finding out test results, communicating by private email, and scheduling acute appointments online when needed.  Please make an Appointment to return for your 1 year visit, or sooner if needed

## 2020-05-06 NOTE — Progress Notes (Signed)
Subjective:    Patient ID: [name], female    DOB: [birth year], 49 y.o.   MRN: [id number]  HPI  Here for wellness and f/u;  Overall doing ok;  Pt denies Chest pain, worsening SOB, DOE, wheezing, orthopnea, PND, worsening LE edema, palpitations, dizziness or syncope.  Pt denies neurological change such as new headache, facial or extremity weakness.  Pt denies polydipsia, polyuria, or low sugar symptoms. Pt states overall good compliance with treatment and medications, good tolerability, and has been trying to follow appropriate diet.  Pt denies worsening depressive symptoms, suicidal ideation or panic. No fever, night sweats, wt loss, loss of appetite, or other constitutional symptoms.  Pt states good ability with ADL's, has low fall risk, home safety reviewed and adequate, no other significant changes in hearing or vision, and only occasionally active with exercise. Gained 10 lb.   Wt Readings from Last 3 Encounters:  05/06/20 152 lb (68.9 kg)  12/01/17 143 lb (64.9 kg)  11/17/16 142 lb (64.4 kg)   Past Medical History:  Diagnosis Date  . ALLERGIC RHINITIS 10/25/2007  . ANXIETY 10/05/2009  . ANXIETY DEPRESSION 10/25/2007  . DEPRESSION 10/05/2009  . GERD (gastroesophageal reflux disease)    occasional-tums prn  . MIGRAINE HEADACHE 10/25/2007   Past Surgical History:  Procedure Laterality Date  . IUD REMOVAL  07/29/2011   Procedure: INTRAUTERINE DEVICE (IUD) REMOVAL;  Surgeon: Darlyn Chamber;  Location: [facility];  Service: Gynecology;  Laterality: N/A;  . LAPAROSCOPIC TUBAL LIGATION  07/29/2011   Procedure: LAPAROSCOPIC TUBAL LIGATION;  Surgeon: Darlyn Chamber;  Location: [facility];  Service: Gynecology;  Laterality: Bilateral;  . NO PAST SURGERIES    . TUBAL LIGATION      reports that she has never smoked. She has never used smokeless tobacco. She reports current alcohol use. She reports that she does not use drugs. family history includes Hypertension in her father and unknown  relative. Allergies  Allergen Reactions  . Penicillins     Childhood reaction - rash    Current Outpatient Medications on File Prior to Visit  Medication Sig Dispense Refill  . ivermectin (STROMECTOL) 3 MG TABS tablet Take 15 mg by mouth daily.    Marland Kitchen triamcinolone cream (KENALOG) 0.1 % Apply 1 application topically 2 (two) times daily. 30 g 0  . zolpidem (AMBIEN) 10 MG tablet TAKE 1 TABLET BY MOUTH EVERY DAY AT BEDTIME AS NEEDED FOR SLEEP 90 tablet 0   No current facility-administered medications on file prior to visit.   Review of Systems All otherwise neg per pt    Objective:   Physical Exam BP 118/80 (BP Location: Left Arm, Patient Position: Sitting, Cuff Size: Large)   Pulse 78   Temp 98.7 F (37.1 C) (Oral)   Ht 5' 8"  (1.727 m)   Wt 152 lb (68.9 kg)   LMP 04/17/2020   SpO2 99%   BMI 23.11 kg/m  VS noted,  Constitutional: Pt appears in NAD HENT: Head: NCAT.  Right Ear: External ear normal.  Left Ear: External ear normal.  Eyes: . Pupils are equal, round, and reactive to light. Conjunctivae and EOM are normal Nose: without d/c or deformity Neck: Neck supple. Gross normal ROM Cardiovascular: Normal rate and regular rhythm.   Pulmonary/Chest: Effort normal and breath sounds without rales or wheezing.  Abd:  Soft, NT, ND, + BS, no organomegaly Neurological: Pt is alert. At baseline orientation, motor grossly intact Skin: Skin is warm. No rashes, other new lesions,  no LE edema Psychiatric: Pt behavior is normal without agitation  All otherwise neg per pt Lab Results  Component Value Date   WBC 5.8 05/06/2020   HGB 13.3 05/06/2020   HCT 39.4 05/06/2020   PLT 218 05/06/2020   GLUCOSE 89 05/06/2020   CHOL 242 (H) 05/06/2020   TRIG 54 05/06/2020   HDL 85 05/06/2020   LDLDIRECT 101.3 03/08/2012   LDLCALC 142 (H) 05/06/2020   ALT 9 05/06/2020   AST 14 05/06/2020   NA 138 05/06/2020   K 4.1 05/06/2020   CL 104 05/06/2020   CREATININE 0.75 05/06/2020   BUN 12  05/06/2020   CO2 26 05/06/2020   TSH 1.01 05/06/2020      Assessment & Plan:

## 2020-05-07 ENCOUNTER — Encounter: Payer: Self-pay | Admitting: Internal Medicine

## 2020-05-07 LAB — TSH: TSH: 1.01 mIU/L

## 2020-05-07 LAB — LIPID PANEL
Cholesterol: 242 mg/dL — ABNORMAL HIGH (ref <200)
HDL: 85 mg/dL (ref >=50)
LDL Cholesterol (Calc): 142 mg/dL (calc) — ABNORMAL HIGH
Non-HDL Cholesterol (Calc): 157 mg/dL (calc) — ABNORMAL HIGH (ref <130)
Total CHOL/HDL Ratio: 2.8 (calc) (ref <5.0)
Triglycerides: 54 mg/dL (ref <150)

## 2020-05-07 LAB — CBC WITH DIFFERENTIAL/PLATELET
Absolute Monocytes: 348 cells/uL (ref 200–950)
Basophils Absolute: 41 cells/uL (ref 0–200)
Basophils Relative: 0.7 %
Eosinophils Absolute: 128 cells/uL (ref 15–500)
Eosinophils Relative: 2.2 %
HCT: 39.4 % (ref 35.0–45.0)
Hemoglobin: 13.3 g/dL (ref 11.7–15.5)
Lymphs Abs: 1444 cells/uL (ref 850–3900)
MCH: 30.1 pg (ref 27.0–33.0)
MCHC: 33.8 g/dL (ref 32.0–36.0)
MCV: 89.1 fL (ref 80.0–100.0)
MPV: 10.9 fL (ref 7.5–12.5)
Monocytes Relative: 6 %
Neutro Abs: 3840 cells/uL (ref 1500–7800)
Neutrophils Relative %: 66.2 %
Platelets: 218 10*3/uL (ref 140–400)
RBC: 4.42 10*6/uL (ref 3.80–5.10)
RDW: 12.3 % (ref 11.0–15.0)
Total Lymphocyte: 24.9 %
WBC: 5.8 10*3/uL (ref 3.8–10.8)

## 2020-05-07 LAB — COMPLETE METABOLIC PANEL WITH GFR
AG Ratio: 1.9 (calc) (ref 1.0–2.5)
ALT: 9 U/L (ref 6–29)
AST: 14 U/L (ref 10–35)
Albumin: 4.3 g/dL (ref 3.6–5.1)
Alkaline phosphatase (APISO): 41 U/L (ref 31–125)
BUN: 12 mg/dL (ref 7–25)
CO2: 26 mmol/L (ref 20–32)
Calcium: 8.6 mg/dL (ref 8.6–10.2)
Chloride: 104 mmol/L (ref 98–110)
Creat: 0.75 mg/dL (ref 0.50–1.10)
GFR, Est African American: 109 mL/min/{1.73_m2} (ref >=60)
GFR, Est Non African American: 94 mL/min/{1.73_m2} (ref >=60)
Globulin: 2.3 g/dL (calc) (ref 1.9–3.7)
Glucose, Bld: 89 mg/dL (ref 65–99)
Potassium: 4.1 mmol/L (ref 3.5–5.3)
Sodium: 138 mmol/L (ref 135–146)
Total Bilirubin: 0.7 mg/dL (ref 0.2–1.2)
Total Protein: 6.6 g/dL (ref 6.1–8.1)

## 2020-05-07 LAB — VITAMIN B12: Vitamin B-12: 608 pg/mL (ref 200–1100)

## 2020-05-07 LAB — URINALYSIS, ROUTINE W REFLEX MICROSCOPIC
Bilirubin Urine: NEGATIVE
Glucose, UA: NEGATIVE
Hgb urine dipstick: NEGATIVE
Ketones, ur: NEGATIVE
Leukocytes,Ua: NEGATIVE
Nitrite: NEGATIVE
Protein, ur: NEGATIVE
Specific Gravity, Urine: 1.009 (ref 1.001–1.03)
pH: 6 (ref 5.0–8.0)

## 2020-05-07 LAB — VITAMIN D 25 HYDROXY (VIT D DEFICIENCY, FRACTURES): Vit D, 25-Hydroxy: 39 ng/mL (ref 30–100)

## 2020-05-09 ENCOUNTER — Encounter: Payer: Self-pay | Admitting: Internal Medicine

## 2020-05-09 NOTE — Assessment & Plan Note (Signed)

## 2020-05-27 ENCOUNTER — Other Ambulatory Visit: Payer: Self-pay | Admitting: Internal Medicine

## 2020-05-27 MED ORDER — TRIAMCINOLONE ACETONIDE 0.1 % EX CREA: 1.0000 "application " | TOPICAL_CREAM | Freq: Two times a day (BID) | CUTANEOUS | 0 refills | Status: AC

## 2020-09-01 ENCOUNTER — Other Ambulatory Visit: Payer: Self-pay | Admitting: Internal Medicine

## 2020-09-16 ENCOUNTER — Other Ambulatory Visit: Payer: Self-pay | Admitting: Internal Medicine

## 2020-10-14 MED ORDER — ZOLPIDEM TARTRATE 10 MG PO TABS: ORAL_TABLET | ORAL | 1 refills | Status: DC

## 2020-10-14 NOTE — Telephone Encounter (Signed)
Possible duplicate

## 2021-04-19 ENCOUNTER — Telehealth: Payer: Self-pay | Admitting: Internal Medicine

## 2021-04-19 DIAGNOSIS — E538 Deficiency of other specified B group vitamins: Secondary | ICD-10-CM

## 2021-04-19 DIAGNOSIS — Z0000 Encounter for general adult medical examination without abnormal findings: Secondary | ICD-10-CM

## 2021-04-19 DIAGNOSIS — E559 Vitamin D deficiency, unspecified: Secondary | ICD-10-CM

## 2021-04-19 NOTE — Addendum Note (Signed)
Addended by: Biagio Borg on: 04/19/2021 01:03 PM   Modules accepted: Orders

## 2021-04-19 NOTE — Telephone Encounter (Signed)
Requesting orders for labs for her CPE on 05/20/21.   Please advise

## 2021-04-19 NOTE — Telephone Encounter (Signed)
Ok orders done

## 2021-04-20 NOTE — Telephone Encounter (Signed)
Pt notified that lab orders have been entered. Pt states she will go to [place] location week prior to appt to have labs collected.

## 2021-05-14 ENCOUNTER — Other Ambulatory Visit (INDEPENDENT_AMBULATORY_CARE_PROVIDER_SITE_OTHER): Payer: 59

## 2021-05-14 DIAGNOSIS — E559 Vitamin D deficiency, unspecified: Secondary | ICD-10-CM | POA: Diagnosis not present

## 2021-05-14 DIAGNOSIS — E538 Deficiency of other specified B group vitamins: Secondary | ICD-10-CM | POA: Diagnosis not present

## 2021-05-14 DIAGNOSIS — Z0000 Encounter for general adult medical examination without abnormal findings: Secondary | ICD-10-CM

## 2021-05-14 LAB — URINALYSIS, ROUTINE W REFLEX MICROSCOPIC
Hgb urine dipstick: NEGATIVE
Ketones, ur: NEGATIVE
Leukocytes,Ua: NEGATIVE
Nitrite: NEGATIVE
RBC / HPF: NONE SEEN (ref 0–?)
Specific Gravity, Urine: >=1.03 — AB (ref 1.000–1.030)
Total Protein, Urine: NEGATIVE
Urine Glucose: NEGATIVE
Urobilinogen, UA: 0.2 (ref 0.0–1.0)
pH: 5.5 (ref 5.0–8.0)

## 2021-05-14 LAB — HEPATIC FUNCTION PANEL
ALT: 10 U/L (ref 0–35)
AST: 13 U/L (ref 0–37)
Albumin: 4.2 g/dL (ref 3.5–5.2)
Alkaline Phosphatase: 40 U/L (ref 39–117)
Bilirubin, Direct: 0.1 mg/dL (ref 0.0–0.3)
Total Bilirubin: 0.6 mg/dL (ref 0.2–1.2)
Total Protein: 6.5 g/dL (ref 6.0–8.3)

## 2021-05-14 LAB — CBC WITH DIFFERENTIAL/PLATELET
Basophils Absolute: 0 10*3/uL (ref 0.0–0.1)
Basophils Relative: 0.9 % (ref 0.0–3.0)
Eosinophils Absolute: 0.1 10*3/uL (ref 0.0–0.7)
Eosinophils Relative: 2.6 % (ref 0.0–5.0)
HCT: 38.6 % (ref 36.0–46.0)
Hemoglobin: 13.1 g/dL (ref 12.0–15.0)
Lymphocytes Relative: 32.2 % (ref 12.0–46.0)
Lymphs Abs: 1.7 10*3/uL (ref 0.7–4.0)
MCHC: 33.9 g/dL (ref 30.0–36.0)
MCV: 89.3 fl (ref 78.0–100.0)
Monocytes Absolute: 0.4 10*3/uL (ref 0.1–1.0)
Monocytes Relative: 7.3 % (ref 3.0–12.0)
Neutro Abs: 3.1 10*3/uL (ref 1.4–7.7)
Neutrophils Relative %: 57 % (ref 43.0–77.0)
Platelets: 230 10*3/uL (ref 150.0–400.0)
RBC: 4.32 Mil/uL (ref 3.87–5.11)
RDW: 13 % (ref 11.5–15.5)
WBC: 5.4 10*3/uL (ref 4.0–10.5)

## 2021-05-14 LAB — VITAMIN B12: Vitamin B-12: 611 pg/mL (ref 211–911)

## 2021-05-14 LAB — LIPID PANEL
Cholesterol: 207 mg/dL — ABNORMAL HIGH (ref 0–200)
HDL: 81.4 mg/dL (ref >39.00)
LDL Cholesterol: 115 mg/dL — ABNORMAL HIGH (ref 0–99)
NonHDL: 125.32
Total CHOL/HDL Ratio: 3
Triglycerides: 53 mg/dL (ref 0.0–149.0)
VLDL: 10.6 mg/dL (ref 0.0–40.0)

## 2021-05-14 LAB — BASIC METABOLIC PANEL
BUN: 12 mg/dL (ref 6–23)
CO2: 26 mEq/L (ref 19–32)
Calcium: 8.8 mg/dL (ref 8.4–10.5)
Chloride: 104 mEq/L (ref 96–112)
Creatinine, Ser: 0.75 mg/dL (ref 0.40–1.20)
GFR: 93.2 mL/min (ref >60.00)
Glucose, Bld: 90 mg/dL (ref 70–99)
Potassium: 4.1 mEq/L (ref 3.5–5.1)
Sodium: 140 mEq/L (ref 135–145)

## 2021-05-14 LAB — TSH: TSH: 1.48 u[IU]/mL (ref 0.35–5.50)

## 2021-05-14 LAB — VITAMIN D 25 HYDROXY (VIT D DEFICIENCY, FRACTURES): VITD: 50.17 ng/mL (ref 30.00–100.00)

## 2021-05-20 ENCOUNTER — Encounter: Payer: Self-pay | Admitting: Internal Medicine

## 2021-05-20 ENCOUNTER — Other Ambulatory Visit: Payer: Self-pay

## 2021-05-20 ENCOUNTER — Ambulatory Visit (INDEPENDENT_AMBULATORY_CARE_PROVIDER_SITE_OTHER): Payer: 59 | Admitting: Internal Medicine

## 2021-05-20 VITALS — BP 108/68 | HR 63 | Temp 98.3°F | Ht 68.0 in | Wt 146.4 lb

## 2021-05-20 DIAGNOSIS — E7800 Pure hypercholesterolemia, unspecified: Secondary | ICD-10-CM | POA: Diagnosis not present

## 2021-05-20 DIAGNOSIS — Z1211 Encounter for screening for malignant neoplasm of colon: Secondary | ICD-10-CM | POA: Diagnosis not present

## 2021-05-20 DIAGNOSIS — Z0000 Encounter for general adult medical examination without abnormal findings: Secondary | ICD-10-CM

## 2021-05-20 DIAGNOSIS — F411 Generalized anxiety disorder: Secondary | ICD-10-CM | POA: Diagnosis not present

## 2021-05-20 DIAGNOSIS — E538 Deficiency of other specified B group vitamins: Secondary | ICD-10-CM

## 2021-05-20 DIAGNOSIS — E559 Vitamin D deficiency, unspecified: Secondary | ICD-10-CM

## 2021-05-20 MED ORDER — CITALOPRAM HYDROBROMIDE 10 MG PO TABS: 10.0000 mg | ORAL_TABLET | Freq: Every day | ORAL | 3 refills | Status: DC

## 2021-05-20 NOTE — Patient Instructions (Addendum)
You will be contacted regarding the referral for: cologuard  Please call if you wish to be referred for counseling  Please continue all other medications as before, and refills have been done if requested.  Please have the pharmacy call with any other refills you may need.  Please continue your efforts at being more active, low cholesterol diet, and weight control.  You are otherwise up to date with prevention measures today.  Please keep your appointments with your specialists as you may have planned  Please make an Appointment to return for your 1 year visit, or sooner if needed, with Lab testing by Appointment as well, to be done about 3-5 days before at the [facility] (so this is for TWO appointments - please see the scheduling desk as you leave)  Due to the ongoing Covid 19 pandemic, our lab now requires an appointment for any labs done at our office.  If you need labs done and do not have an appointment, please call our office ahead of time to schedule before presenting to the lab for your testing.

## 2021-05-20 NOTE — Progress Notes (Signed)
Patient ID: [name], female   DOB: [birth year], 50 y.o.   MRN: [id number]         Chief Complaint:: wellness exam and hld, anxiety       HPI:  [name] is a 50 y.o. female here for wellness exam; declines flu shot, colonoscopy, hep c screen, ow up to date with preventive referrals and immunizations                        Also trying to work on lower chol diet after getting away from that in the past year or so.  Also with more stress recently after father and brother died, currently lives with mother with whom there is friction, and other relationship issues as well have been difficult; Denies worsening depressive symptoms, suicidal ideation, or panic; but will need refill SSRI.  Pt denies chest pain, increased sob or doe, wheezing, orthopnea, PND, increased LE swelling, palpitations, dizziness or syncope.   Pt denies polydipsia, polyuria, or new focall neuro s/s.   Pt denies fever, wt loss, night sweats, loss of appetite, or other constitutional symptoms  No other new complaints   Wt Readings from Last 3 Encounters:  05/20/21 146 lb 6.4 oz (66.4 kg)  05/06/20 152 lb (68.9 kg)  12/01/17 143 lb (64.9 kg)   BP Readings from Last 3 Encounters:  05/20/21 108/68  05/06/20 118/80  12/01/17 102/68   Immunization History  Administered Date(s) Administered   Td 01/29/2007   Tdap 12/01/2017   There are no preventive care reminders to display for this patient.     Past Medical History:  Diagnosis Date   ALLERGIC RHINITIS 10/25/2007   ANXIETY 10/05/2009   ANXIETY DEPRESSION 10/25/2007   DEPRESSION 10/05/2009   GERD (gastroesophageal reflux disease)    occasional-tums prn   MIGRAINE HEADACHE 10/25/2007   Past Surgical History:  Procedure Laterality Date   IUD REMOVAL  07/29/2011   Procedure: INTRAUTERINE DEVICE (IUD) REMOVAL;  Surgeon: Darlyn Chamber;  Location: [facility];  Service: Gynecology;  Laterality: N/A;   LAPAROSCOPIC TUBAL LIGATION  07/29/2011   Procedure: LAPAROSCOPIC TUBAL  LIGATION;  Surgeon: Darlyn Chamber;  Location: [facility];  Service: Gynecology;  Laterality: Bilateral;   NO PAST SURGERIES     TUBAL LIGATION      reports that she has never smoked. She has never used smokeless tobacco. She reports current alcohol use. She reports that she does not use drugs. family history includes Hypertension in her father and unknown relative. Allergies  Allergen Reactions   Penicillins     Childhood reaction - rash    Current Outpatient Medications on File Prior to Visit  Medication Sig Dispense Refill   triamcinolone cream (KENALOG) 0.1 % Apply 1 application topically 2 (two) times daily. 30 g 0   valACYclovir (VALTREX) 1000 MG tablet Take 1 tablet (1,000 mg total) by mouth 2 (two) times daily. Must keep scheduled appt for future refills 30 tablet 2   zolpidem (AMBIEN) 10 MG tablet TAKE 1 TABLET BY MOUTH EVERY DAY AT BEDTIME AS NEEDED FOR SLEEP 90 tablet 1   ivermectin (STROMECTOL) 3 MG TABS tablet Take 15 mg by mouth daily. (Patient not taking: Reported on 05/20/2021)     No current facility-administered medications on file prior to visit.        ROS:  All others reviewed and negative.  Objective        PE:  BP 108/68 (BP Location:  Right Arm, Patient Position: Sitting, Cuff Size: Normal)   Pulse 63   Temp 98.3 F (36.8 C) (Oral)   Ht 5' 8"  (1.727 m)   Wt 146 lb 6.4 oz (66.4 kg)   SpO2 99%   BMI 22.26 kg/m                 Constitutional: Pt appears in NAD               HENT: Head: NCAT.                Right Ear: External ear normal.                 Left Ear: External ear normal.                Eyes: . Pupils are equal, round, and reactive to light. Conjunctivae and EOM are normal               Nose: without d/c or deformity               Neck: Neck supple. Gross normal ROM               Cardiovascular: Normal rate and regular rhythm.                 Pulmonary/Chest: Effort normal and breath sounds without rales or wheezing.                Abd:  Soft, NT,  ND, + BS, no organomegaly               Neurological: Pt is alert. At baseline orientation, motor grossly intact               Skin: Skin is warm. No rashes, no other new lesions, LE edema - none               Psychiatric: Pt behavior is normal without agitation   Micro: none  Cardiac tracings I have personally interpreted today:  none  Pertinent Radiological findings (summarize): none   Lab Results  Component Value Date   WBC 5.4 05/14/2021   HGB 13.1 05/14/2021   HCT 38.6 05/14/2021   PLT 230.0 05/14/2021   GLUCOSE 90 05/14/2021   CHOL 207 (H) 05/14/2021   TRIG 53.0 05/14/2021   HDL 81.40 05/14/2021   LDLDIRECT 101.3 03/08/2012   LDLCALC 115 (H) 05/14/2021   ALT 10 05/14/2021   AST 13 05/14/2021   NA 140 05/14/2021   K 4.1 05/14/2021   CL 104 05/14/2021   CREATININE 0.75 05/14/2021   BUN 12 05/14/2021   CO2 26 05/14/2021   TSH 1.48 05/14/2021   Assessment/Plan:  [name] is a 50 y.o. White or Caucasian [1] female with  has a past medical history of ALLERGIC RHINITIS (10/25/2007), ANXIETY (10/05/2009), ANXIETY DEPRESSION (10/25/2007), DEPRESSION (10/05/2009), GERD (gastroesophageal reflux disease), and MIGRAINE HEADACHE (10/25/2007).  Preventative health care Age and sex appropriate education and counseling updated with regular exercise and diet Referrals for preventative services - declines colonoscopy, hep c screen, but will do cologuard Immunizations addressed - declines flu shot Smoking counseling  - none needed Evidence for depression or other mood disorder - anxiety chronic persistent Most recent labs reviewed. I have personally reviewed and have noted: 1) the patient's medical and social history 2) The patient's current medications and supplements 3) The patient's height, weight, and BMI have been recorded in the chart   Anxiety state  To continue SSRI  HYPERCHOLESTEROLEMIA Lab Results  Component Value Date   LDLCALC 115 (H) 05/14/2021   Mild  uncontrolled, goal ldl < 100, pt to continue low chol diet, declines statin  Followup: No follow-ups on file.  Cathlean Cower, MD 05/23/2021 7:02 PM [facility]

## 2021-05-23 ENCOUNTER — Encounter: Payer: Self-pay | Admitting: Internal Medicine

## 2021-05-23 NOTE — Addendum Note (Signed)
Addended by: Biagio Borg on: 05/23/2021 07:03 PM   Modules accepted: Orders

## 2021-05-23 NOTE — Assessment & Plan Note (Signed)
To continue SSRI

## 2021-05-23 NOTE — Assessment & Plan Note (Signed)
Lab Results  Component Value Date   LDLCALC 115 (H) 05/14/2021   Mild uncontrolled, goal ldl < 100, pt to continue low chol diet, declines statin

## 2021-05-23 NOTE — Assessment & Plan Note (Addendum)
Age and sex appropriate education and counseling updated with regular exercise and diet Referrals for preventative services - declines colonoscopy, hep c screen, but will do cologuard Immunizations addressed - declines flu shot Smoking counseling  - none needed Evidence for depression or other mood disorder - anxiety chronic persistent Most recent labs reviewed. I have personally reviewed and have noted: 1) the patient's medical and social history 2) The patient's current medications and supplements 3) The patient's height, weight, and BMI have been recorded in the chart

## 2021-07-02 ENCOUNTER — Encounter: Payer: Self-pay | Admitting: Internal Medicine

## 2021-07-02 LAB — COLOGUARD: COLOGUARD: NEGATIVE

## 2021-08-16 ENCOUNTER — Telehealth: Payer: Self-pay | Admitting: Internal Medicine

## 2021-08-16 NOTE — Telephone Encounter (Signed)
Patient inquiring about her partner becoming a new patient   Informed patient provider was not accepting new patient's at the moment  Patient requesting a call back

## 2021-08-16 NOTE — Telephone Encounter (Signed)
Left message for patient to call me back; provider not accepting new patients

## 2021-08-20 NOTE — Telephone Encounter (Signed)
Patient notified

## 2021-09-30 ENCOUNTER — Other Ambulatory Visit: Payer: Self-pay

## 2021-09-30 ENCOUNTER — Encounter: Payer: Self-pay | Admitting: Plastic Surgery

## 2021-09-30 ENCOUNTER — Ambulatory Visit (INDEPENDENT_AMBULATORY_CARE_PROVIDER_SITE_OTHER): Payer: Self-pay | Admitting: Plastic Surgery

## 2021-09-30 VITALS — BP 120/78 | HR 64 | Ht 68.0 in | Wt 145.0 lb

## 2021-09-30 DIAGNOSIS — Z719 Counseling, unspecified: Secondary | ICD-10-CM

## 2021-09-30 NOTE — Progress Notes (Signed)
° °    Patient ID: [name], female    DOB: 1971/04/16, 51 y.o.   MRN: [id number]   Chief Complaint  Patient presents with   Advice Only    The patient is a 51 year old female here for evaluation of her face.  She is interested in rejuvenation for aging.  She does not like her jowls or the neck area.  She has a little bit of hyperpigmentation and redness but overall her skin seems to be in good health.  She is wanting to be conservative in her treatment at this time.   Review of Systems  Constitutional: Negative.   Eyes: Negative.   Respiratory: Negative.    Cardiovascular: Negative.   Gastrointestinal: Negative.   Endocrine: Negative.   Genitourinary: Negative.    Past Medical History:  Diagnosis Date   ALLERGIC RHINITIS 10/25/2007   ANXIETY 10/05/2009   ANXIETY DEPRESSION 10/25/2007   DEPRESSION 10/05/2009   GERD (gastroesophageal reflux disease)    occasional-tums prn   MIGRAINE HEADACHE 10/25/2007    Past Surgical History:  Procedure Laterality Date   IUD REMOVAL  07/29/2011   Procedure: INTRAUTERINE DEVICE (IUD) REMOVAL;  Surgeon: Darlyn Chamber;  Location: [facility];  Service: Gynecology;  Laterality: N/A;   LAPAROSCOPIC TUBAL LIGATION  07/29/2011   Procedure: LAPAROSCOPIC TUBAL LIGATION;  Surgeon: Darlyn Chamber;  Location: [facility];  Service: Gynecology;  Laterality: Bilateral;   NO PAST SURGERIES     TUBAL LIGATION        Current Outpatient Medications:    citalopram (CELEXA) 10 MG tablet, Take 1 tablet (10 mg total) by mouth daily., Disp: 90 tablet, Rfl: 3   ivermectin (STROMECTOL) 3 MG TABS tablet, Take 15 mg by mouth daily. (Patient not taking: Reported on 05/20/2021), Disp: , Rfl:    triamcinolone cream (KENALOG) 0.1 %, Apply 1 application topically 2 (two) times daily. (Patient not taking: Reported on 09/30/2021), Disp: 30 g, Rfl: 0   valACYclovir (VALTREX) 1000 MG tablet, Take 1 tablet (1,000 mg total) by mouth 2 (two) times daily. Must keep scheduled appt for future  refills (Patient not taking: Reported on 09/30/2021), Disp: 30 tablet, Rfl: 2   zolpidem (AMBIEN) 10 MG tablet, TAKE 1 TABLET BY MOUTH EVERY DAY AT BEDTIME AS NEEDED FOR SLEEP (Patient not taking: Reported on 09/30/2021), Disp: 90 tablet, Rfl: 1   Objective:   Vitals:   09/30/21 1338  BP: 120/78  Pulse: 64  SpO2: 98%    Physical Exam Constitutional:      Appearance: Normal appearance.  HENT:     Head: Normocephalic and atraumatic.  Cardiovascular:     Rate and Rhythm: Normal rate.  Neurological:     Mental Status: She is alert and oriented to person, place, and time.  Psychiatric:        Mood and Affect: Mood normal.        Behavior: Behavior normal.        Thought Content: Thought content normal.    Assessment & Plan:  Encounter for counseling  We discussed options for surgical management versus screenings and laser treatment. We reveiwed BBL and Halo.  She is a good candidate for Sente as well.  I will send her some information.   [place], DO

## 2021-10-14 DIAGNOSIS — N9089 Other specified noninflammatory disorders of vulva and perineum: Secondary | ICD-10-CM | POA: Diagnosis not present

## 2021-10-14 DIAGNOSIS — B3731 Acute candidiasis of vulva and vagina: Secondary | ICD-10-CM | POA: Diagnosis not present

## 2021-10-19 ENCOUNTER — Other Ambulatory Visit (HOSPITAL_COMMUNITY): Payer: Self-pay

## 2021-10-19 MED ORDER — LIDOCAINE 23% - TETRACAINE 7% TOPICAL OINTMENT (PLASTICIZED)
1.0000 "application " | TOPICAL_OINTMENT | Freq: Once | CUTANEOUS | 0 refills | Status: AC
  Filled 2021-10-19: qty 60, 1d supply, fill #0

## 2021-10-19 NOTE — Addendum Note (Signed)
Addended by: Wallace Going on: 10/19/2021 07:34 AM   Modules accepted: Orders

## 2021-10-20 ENCOUNTER — Other Ambulatory Visit (HOSPITAL_COMMUNITY): Payer: Self-pay

## 2021-10-20 ENCOUNTER — Encounter: Payer: Self-pay | Admitting: Plastic Surgery

## 2021-10-20 ENCOUNTER — Ambulatory Visit (INDEPENDENT_AMBULATORY_CARE_PROVIDER_SITE_OTHER): Payer: Self-pay | Admitting: Plastic Surgery

## 2021-10-20 ENCOUNTER — Other Ambulatory Visit: Payer: Self-pay

## 2021-10-20 DIAGNOSIS — Z719 Counseling, unspecified: Secondary | ICD-10-CM

## 2021-10-20 NOTE — Progress Notes (Signed)
[place] Preoperative Dx: hyperpigmentation of face  Postoperative Dx:  same  Procedure: laser to face and neck   Anesthesia: none  Description of Procedure:  Risks and complications were explained to the patient. Consent was confirmed and signed. Eye protection was placed. Time out was called and all information was confirmed to be correct. The area  area was prepped with alcohol and wiped dry. The BBL laser was set at 590 nm and 6 J/cm2. Then 515 nm and 6 J. The face and neck were lasered. The patient tolerated the procedure well and there were no complications. The patient is to follow up in 4 weeks.

## 2021-10-22 ENCOUNTER — Other Ambulatory Visit: Payer: Self-pay | Admitting: Plastic Surgery

## 2021-11-08 ENCOUNTER — Encounter: Payer: Self-pay | Admitting: Plastic Surgery

## 2021-11-23 ENCOUNTER — Other Ambulatory Visit: Payer: Self-pay | Admitting: Surgical

## 2021-12-10 ENCOUNTER — Other Ambulatory Visit: Payer: Self-pay | Admitting: Plastic Surgery

## 2021-12-21 ENCOUNTER — Other Ambulatory Visit: Payer: Self-pay | Admitting: Surgical

## 2022-01-11 ENCOUNTER — Other Ambulatory Visit: Payer: Self-pay | Admitting: Plastic Surgery

## 2022-01-19 ENCOUNTER — Telehealth: Payer: Self-pay | Admitting: Plastic Surgery

## 2022-01-19 NOTE — Telephone Encounter (Signed)
Recd vcmail to return call ptn unhappy with refund -- $600 -- lvm.  I explained to patient when she called and asked the refund would be  small - that the largest part of the charges go towards first visit - last appt is bascially free - if she calls back and asks

## 2022-01-19 NOTE — Telephone Encounter (Signed)
Ptn is very upset with me that I didn't explain how much she would lose - told her repeatedly the large majority of pnt is up front - explaining halo is going to go up in the fall was secondary to the BBL refund - I recommended she extend the appt out until she stopped getting in the sun in the fall.  She told me I didn't care and hung up on me.

## 2022-01-28 ENCOUNTER — Other Ambulatory Visit: Payer: Self-pay | Admitting: Plastic Surgery

## 2022-03-07 DIAGNOSIS — Z124 Encounter for screening for malignant neoplasm of cervix: Secondary | ICD-10-CM | POA: Diagnosis not present

## 2022-03-07 DIAGNOSIS — Z01419 Encounter for gynecological examination (general) (routine) without abnormal findings: Secondary | ICD-10-CM | POA: Diagnosis not present

## 2022-03-07 DIAGNOSIS — Z1231 Encounter for screening mammogram for malignant neoplasm of breast: Secondary | ICD-10-CM | POA: Diagnosis not present

## 2022-03-07 DIAGNOSIS — Z6821 Body mass index (BMI) 21.0-21.9, adult: Secondary | ICD-10-CM | POA: Diagnosis not present

## 2022-03-07 DIAGNOSIS — R69 Illness, unspecified: Secondary | ICD-10-CM | POA: Diagnosis not present

## 2022-03-07 DIAGNOSIS — Z113 Encounter for screening for infections with a predominantly sexual mode of transmission: Secondary | ICD-10-CM | POA: Diagnosis not present

## 2022-03-09 ENCOUNTER — Other Ambulatory Visit: Payer: Self-pay | Admitting: Obstetrics and Gynecology

## 2022-03-09 DIAGNOSIS — Z8249 Family history of ischemic heart disease and other diseases of the circulatory system: Secondary | ICD-10-CM

## 2022-03-28 DIAGNOSIS — Z13820 Encounter for screening for osteoporosis: Secondary | ICD-10-CM | POA: Diagnosis not present

## 2022-05-06 ENCOUNTER — Ambulatory Visit
Admission: RE | Admit: 2022-05-06 | Discharge: 2022-05-06 | Disposition: A | Discharge status: Home / Self Care | Payer: No Typology Code available for payment source | Source: Ambulatory Visit | Attending: Obstetrics and Gynecology | Admitting: Obstetrics and Gynecology

## 2022-05-06 ENCOUNTER — Other Ambulatory Visit: Payer: Self-pay

## 2022-05-06 DIAGNOSIS — Z8249 Family history of ischemic heart disease and other diseases of the circulatory system: Secondary | ICD-10-CM

## 2022-05-30 ENCOUNTER — Encounter: Payer: Self-pay | Admitting: Internal Medicine

## 2022-05-30 ENCOUNTER — Ambulatory Visit (INDEPENDENT_AMBULATORY_CARE_PROVIDER_SITE_OTHER): Payer: 59 | Admitting: Internal Medicine

## 2022-05-30 VITALS — BP 120/66 | HR 59 | Temp 98.0°F | Ht 68.0 in | Wt 140.0 lb

## 2022-05-30 DIAGNOSIS — M8580 Other specified disorders of bone density and structure, unspecified site: Secondary | ICD-10-CM | POA: Diagnosis not present

## 2022-05-30 DIAGNOSIS — Z0001 Encounter for general adult medical examination with abnormal findings: Secondary | ICD-10-CM | POA: Insufficient documentation

## 2022-05-30 DIAGNOSIS — R739 Hyperglycemia, unspecified: Secondary | ICD-10-CM | POA: Diagnosis not present

## 2022-05-30 DIAGNOSIS — E7800 Pure hypercholesterolemia, unspecified: Secondary | ICD-10-CM | POA: Diagnosis not present

## 2022-05-30 DIAGNOSIS — E559 Vitamin D deficiency, unspecified: Secondary | ICD-10-CM | POA: Diagnosis not present

## 2022-05-30 DIAGNOSIS — F32A Depression, unspecified: Secondary | ICD-10-CM

## 2022-05-30 DIAGNOSIS — E538 Deficiency of other specified B group vitamins: Secondary | ICD-10-CM

## 2022-05-30 DIAGNOSIS — R69 Illness, unspecified: Secondary | ICD-10-CM | POA: Diagnosis not present

## 2022-05-30 DIAGNOSIS — Z0000 Encounter for general adult medical examination without abnormal findings: Secondary | ICD-10-CM | POA: Insufficient documentation

## 2022-05-30 LAB — CBC WITH DIFFERENTIAL/PLATELET
Basophils Absolute: 0 10*3/uL (ref 0.0–0.1)
Basophils Relative: 0.8 % (ref 0.0–3.0)
Eosinophils Absolute: 0.1 10*3/uL (ref 0.0–0.7)
Eosinophils Relative: 2.6 % (ref 0.0–5.0)
HCT: 38.9 % (ref 36.0–46.0)
Hemoglobin: 13.5 g/dL (ref 12.0–15.0)
Lymphocytes Relative: 33.9 % (ref 12.0–46.0)
Lymphs Abs: 1.9 10*3/uL (ref 0.7–4.0)
MCHC: 34.7 g/dL (ref 30.0–36.0)
MCV: 86.7 fl (ref 78.0–100.0)
Monocytes Absolute: 0.4 10*3/uL (ref 0.1–1.0)
Monocytes Relative: 6.6 % (ref 3.0–12.0)
Neutro Abs: 3.2 10*3/uL (ref 1.4–7.7)
Neutrophils Relative %: 56.1 % (ref 43.0–77.0)
Platelets: 302 10*3/uL (ref 150.0–400.0)
RBC: 4.49 Mil/uL (ref 3.87–5.11)
RDW: 12.8 % (ref 11.5–15.5)
WBC: 5.7 10*3/uL (ref 4.0–10.5)

## 2022-05-30 LAB — HEPATIC FUNCTION PANEL
ALT: 11 U/L (ref 0–35)
AST: 17 U/L (ref 0–37)
Albumin: 4.3 g/dL (ref 3.5–5.2)
Alkaline Phosphatase: 38 U/L — ABNORMAL LOW (ref 39–117)
Bilirubin, Direct: 0.1 mg/dL (ref 0.0–0.3)
Total Bilirubin: 0.6 mg/dL (ref 0.2–1.2)
Total Protein: 7.2 g/dL (ref 6.0–8.3)

## 2022-05-30 LAB — TSH: TSH: 0.65 u[IU]/mL (ref 0.35–5.50)

## 2022-05-30 LAB — HEMOGLOBIN A1C: Hgb A1c MFr Bld: 5.5 % (ref 4.6–6.5)

## 2022-05-30 LAB — LIPID PANEL
Cholesterol: 212 mg/dL — ABNORMAL HIGH (ref 0–200)
HDL: 73.2 mg/dL (ref >39.00)
LDL Cholesterol: 130 mg/dL — ABNORMAL HIGH (ref 0–99)
NonHDL: 138.36
Total CHOL/HDL Ratio: 3
Triglycerides: 44 mg/dL (ref 0.0–149.0)
VLDL: 8.8 mg/dL (ref 0.0–40.0)

## 2022-05-30 LAB — BASIC METABOLIC PANEL
BUN: 14 mg/dL (ref 6–23)
CO2: 27 mEq/L (ref 19–32)
Calcium: 9.2 mg/dL (ref 8.4–10.5)
Chloride: 102 mEq/L (ref 96–112)
Creatinine, Ser: 0.78 mg/dL (ref 0.40–1.20)
GFR: 88.26 mL/min (ref >60.00)
Glucose, Bld: 83 mg/dL (ref 70–99)
Potassium: 4 mEq/L (ref 3.5–5.1)
Sodium: 136 mEq/L (ref 135–145)

## 2022-05-30 LAB — VITAMIN D 25 HYDROXY (VIT D DEFICIENCY, FRACTURES): VITD: 86.73 ng/mL (ref 30.00–100.00)

## 2022-05-30 LAB — VITAMIN B12: Vitamin B-12: 684 pg/mL (ref 211–911)

## 2022-05-30 MED ORDER — CITALOPRAM HYDROBROMIDE 10 MG PO TABS: 10.0000 mg | ORAL_TABLET | Freq: Every day | ORAL | 3 refills | Status: DC

## 2022-05-30 NOTE — Assessment & Plan Note (Addendum)
Lab Results  Component Value Date   LDLCALC 115 (H) 05/14/2021   Uncontrolled, goal ldl < 100, pt to continue lower chol diet, with f/u lab today, declines statin

## 2022-05-30 NOTE — Progress Notes (Addendum)
Patient ID: [name], female   DOB: Feb 12, 1971, 51 y.o.   MRN: [id number]         Chief Complaint:: wellness exam and osteopenia, elevated Card CT score       HPI:  [name] is a 51 y.o. female here for wellness exam; had neg cologuard oct 2022; o/w up to date                        Also had CT cardiac score 33n (95%), has appt with cardiology soon.  Had DXA recently c/w osteopenia with t score at -1.5.  For Vit D, calcium and wt bearing exercise  Pt denies chest pain, increased sob or doe, wheezing, orthopnea, PND, increased LE swelling, palpitations, dizziness or syncope.   Pt denies polydipsia, polyuria, or new focal neuro s/s.    Pt denies fever, wt loss, night sweats, loss of appetite, or other constitutional symptoms   Wt Readings from Last 3 Encounters:  05/30/22 140 lb (63.5 kg)  09/30/21 145 lb (65.8 kg)  05/20/21 146 lb 6.4 oz (66.4 kg)   BP Readings from Last 3 Encounters:  05/30/22 120/66  09/30/21 120/78  05/20/21 108/68   Immunization History  Administered Date(s) Administered   Td 01/29/2007   Tdap 12/01/2017  There are no preventive care reminders to display for this patient.    Past Medical History:  Diagnosis Date   ALLERGIC RHINITIS 10/25/2007   ANXIETY 10/05/2009   ANXIETY DEPRESSION 10/25/2007   DEPRESSION 10/05/2009   GERD (gastroesophageal reflux disease)    occasional-tums prn   MIGRAINE HEADACHE 10/25/2007   Past Surgical History:  Procedure Laterality Date   IUD REMOVAL  07/29/2011   Procedure: INTRAUTERINE DEVICE (IUD) REMOVAL;  Surgeon: Darlyn Chamber;  Location: [facility];  Service: Gynecology;  Laterality: N/A;   LAPAROSCOPIC TUBAL LIGATION  07/29/2011   Procedure: LAPAROSCOPIC TUBAL LIGATION;  Surgeon: Darlyn Chamber;  Location: [facility];  Service: Gynecology;  Laterality: Bilateral;   NO PAST SURGERIES     TUBAL LIGATION      reports that she has never smoked. She has never used smokeless tobacco. She reports current alcohol use. She reports  that she does not use drugs. family history includes Hypertension in her father and unknown relative. Allergies  Allergen Reactions   Penicillins     Childhood reaction - rash    Current Outpatient Medications on File Prior to Visit  Medication Sig Dispense Refill   fluconazole (DIFLUCAN) 150 MG tablet Take 1 tablet by oral route for 1 day.     triamcinolone cream (KENALOG) 0.1 % Apply 1 application topically 2 (two) times daily. 30 g 0   valACYclovir (VALTREX) 1000 MG tablet Take 1 tablet (1,000 mg total) by mouth 2 (two) times daily. Must keep scheduled appt for future refills 30 tablet 2   zolpidem (AMBIEN) 10 MG tablet TAKE 1 TABLET BY MOUTH EVERY DAY AT BEDTIME AS NEEDED FOR SLEEP 90 tablet 1   metroNIDAZOLE (METROGEL) 0.75 % vaginal gel Place vaginally.     No current facility-administered medications on file prior to visit.        ROS:  All others reviewed and negative.  Objective        PE:  BP 120/66 (BP Location: Right Arm, Patient Position: Sitting, Cuff Size: Large)   Pulse (!) 59   Temp 98 F (36.7 C) (Oral)   Ht '5\' 8"'$  (1.727 m)   Wt  140 lb (63.5 kg)   SpO2 97%   BMI 21.29 kg/m                 Constitutional: Pt appears in NAD               HENT: Head: NCAT.                Right Ear: External ear normal.                 Left Ear: External ear normal.                Eyes: . Pupils are equal, round, and reactive to light. Conjunctivae and EOM are normal               Nose: without d/c or deformity               Neck: Neck supple. Gross normal ROM               Cardiovascular: Normal rate and regular rhythm.                 Pulmonary/Chest: Effort normal and breath sounds without rales or wheezing.                Abd:  Soft, NT, ND, + BS, no organomegaly               Neurological: Pt is alert. At baseline orientation, motor grossly intact               Skin: Skin is warm. No rashes, no other new lesions, LE edema - none               Psychiatric: Pt behavior is  normal without agitation   Micro: none  Cardiac tracings I have personally interpreted today:  none  Pertinent Radiological findings (summarize): none   Lab Results  Component Value Date   WBC 5.4 05/14/2021   HGB 13.1 05/14/2021   HCT 38.6 05/14/2021   PLT 230.0 05/14/2021   GLUCOSE 90 05/14/2021   CHOL 207 (H) 05/14/2021   TRIG 53.0 05/14/2021   HDL 81.40 05/14/2021   LDLDIRECT 101.3 03/08/2012   LDLCALC 115 (H) 05/14/2021   ALT 10 05/14/2021   AST 13 05/14/2021   NA 140 05/14/2021   K 4.1 05/14/2021   CL 104 05/14/2021   CREATININE 0.75 05/14/2021   BUN 12 05/14/2021   CO2 26 05/14/2021   TSH 1.48 05/14/2021   Assessment/Plan:  [name] is a 51 y.o. White or Caucasian [1] female with  has a past medical history of ALLERGIC RHINITIS (10/25/2007), ANXIETY (10/05/2009), ANXIETY DEPRESSION (10/25/2007), DEPRESSION (10/05/2009), GERD (gastroesophageal reflux disease), and MIGRAINE HEADACHE (10/25/2007).  HYPERCHOLESTEROLEMIA Lab Results  Component Value Date   LDLCALC 115 (H) 05/14/2021   Uncontrolled, goal ldl < 100, pt to continue lower chol diet, with f/u lab today, declines statin  Encounter for well adult exam with abnormal findings Age and sex appropriate education and counseling updated with regular exercise and diet Referrals for preventative services - none needed Immunizations addressed - declines flu shot, for shingrx at the pharmacy Smoking counseling  - none needed Evidence for depression or other mood disorder - stable depression, to cont celexa Most recent labs reviewed. I have personally reviewed and have noted: 1) the patient's medical and social history 2) The patient's current medications and supplements 3) The patient's height, weight, and BMI have been recorded in  the chart   Depression Chronic stable and improved this year, cont current med tx celexa 10 mg qd Followup: Return in about 1 year (around 05/31/2023).  Cathlean Cower, MD 05/30/2022 3:08  PM [facility]

## 2022-05-30 NOTE — Patient Instructions (Addendum)
Please have your Shingrix (shingles) shots done at your local pharmacy.  Please continue all other medications as before, and refills have been done if requested.  Please have the pharmacy call with any other refills you may need.  Please continue your efforts at being more active, low cholesterol diet, and weight control.  You are otherwise up to date with prevention measures today.  Please keep your appointments with your specialists as you may have planned - cardiology soon  Please go to the LAB at the blood drawing area for the tests to be done  You will be contacted by phone if any changes need to be made immediately.  Otherwise, you will receive a letter about your results with an explanation, but please check with MyChart first.  Please remember to sign up for MyChart if you have not done so, as this will be important to you in the future with finding out test results, communicating by private email, and scheduling acute appointments online when needed.  Please make an Appointment to return for your 1 year visit, or sooner if needed

## 2022-05-30 NOTE — Assessment & Plan Note (Signed)
Chronic stable and improved this year, cont current med tx celexa 10 mg qd

## 2022-05-30 NOTE — Assessment & Plan Note (Signed)
Age and sex appropriate education and counseling updated with regular exercise and diet Referrals for preventative services - none needed Immunizations addressed - declines flu shot, for shingrx at the pharmacy Smoking counseling  - none needed Evidence for depression or other mood disorder - stable depression, to cont celexa Most recent labs reviewed. I have personally reviewed and have noted: 1) the patient's medical and social history 2) The patient's current medications and supplements 3) The patient's height, weight, and BMI have been recorded in the chart

## 2022-05-31 ENCOUNTER — Encounter: Payer: Self-pay | Admitting: Internal Medicine

## 2022-05-31 LAB — URINALYSIS, ROUTINE W REFLEX MICROSCOPIC
Bilirubin Urine: NEGATIVE
Hgb urine dipstick: NEGATIVE
Ketones, ur: 15 — AB
Leukocytes,Ua: NEGATIVE
Nitrite: NEGATIVE
RBC / HPF: NONE SEEN (ref 0–?)
Specific Gravity, Urine: 1.025 (ref 1.000–1.030)
Total Protein, Urine: NEGATIVE
Urine Glucose: NEGATIVE
Urobilinogen, UA: 0.2 (ref 0.0–1.0)
WBC, UA: NONE SEEN (ref 0–?)
pH: 6 (ref 5.0–8.0)

## 2022-05-31 LAB — PTH, INTACT AND CALCIUM
Calcium: 9.1 mg/dL (ref 8.6–10.4)
PTH: 43 pg/mL (ref 16–77)

## 2022-06-17 ENCOUNTER — Ambulatory Visit: Payer: 59 | Admitting: Internal Medicine

## 2022-07-08 ENCOUNTER — Ambulatory Visit: Payer: 59 | Admitting: Internal Medicine

## 2022-07-12 ENCOUNTER — Encounter: Payer: Self-pay | Admitting: Cardiovascular Disease

## 2022-07-12 ENCOUNTER — Ambulatory Visit: Payer: 59 | Attending: Internal Medicine | Admitting: Cardiovascular Disease

## 2022-07-12 VITALS — BP 122/76 | HR 71 | Ht 67.0 in | Wt 138.0 lb

## 2022-07-12 DIAGNOSIS — E7800 Pure hypercholesterolemia, unspecified: Secondary | ICD-10-CM | POA: Diagnosis not present

## 2022-07-12 DIAGNOSIS — R931 Abnormal findings on diagnostic imaging of heart and coronary circulation: Secondary | ICD-10-CM

## 2022-07-12 NOTE — Progress Notes (Unsigned)
Cardiology Office Note:    Date:  07/13/2022   ID:  [name], DOB [birth year], MRN [id number]  PCP:  Sydney Borg, MD   [place] Providers Cardiologist:  None     Referring MD: Sydney Nigh, MD   No chief complaint on file. [name] is a 51 y.o. female who is being seen today for the evaluation of elevated coronary calcium score at the request of Sydney Nigh, MD.   History of Present Illness:    [name] is a 51 y.o. female with a hx of generally excellent health with the exception of occasional migraines, GERD and anxiety.  She recently had a coronary calcium score of 33, 95th percentile for ar age/gender.  She does not have multiple coronary risk factors.  She does not smoke, does not have diabetes mellitus or hypertension, has no history of cardiovascular problems and no close relatives with heart disease or peripheral vascular disease.  Most recent labs show total cholesterol 212, HDL 73, LDL 130.  Throughout the last 8 years her LDL cholesterol has generally been similar 104-142.  Triglycerides are consistently normal, her recent hemoglobin A1c was 5.5% and TSH was normal.  She thinks that she is starting to have perimenopausal symptoms.  She is firmly against taking statin medications.  She has heard too many stories about side effects from other people.  She has never taken statins resolved.  Her diet is generally rich in red meat, not so many vegetables.  She does not like fish.  She does eat nuts and likes avocado.  She does regular exercise of moderate intensity (Pilates, barre), probably less than 2-1/2 hours a week.  She has a sedentary office job.   Past Medical History:  Diagnosis Date   ALLERGIC RHINITIS 10/25/2007   ANXIETY 10/05/2009   ANXIETY DEPRESSION 10/25/2007   DEPRESSION 10/05/2009   GERD (gastroesophageal reflux disease)    occasional-tums prn   MIGRAINE HEADACHE 10/25/2007    Past Surgical History:  Procedure Laterality Date    IUD REMOVAL  07/29/2011   Procedure: INTRAUTERINE DEVICE (IUD) REMOVAL;  Surgeon: [name];  Location: [facility];  Service: Gynecology;  Laterality: N/A;   LAPAROSCOPIC TUBAL LIGATION  07/29/2011   Procedure: LAPAROSCOPIC TUBAL LIGATION;  Surgeon: [name];  Location: [facility];  Service: Gynecology;  Laterality: Bilateral;   NO PAST SURGERIES     TUBAL LIGATION      Current Medications: Current Meds  Medication Sig   Ascorbic Acid (VITAMIN C) 1000 MG tablet Take 1,000 mg by mouth daily.   B Complex-C (B-COMPLEX WITH VITAMIN C) tablet Take 1 tablet by mouth daily.   citalopram (CELEXA) 10 MG tablet Take 1 tablet (10 mg total) by mouth daily.   OVER THE COUNTER MEDICATION Take 1 capsule by mouth daily in the afternoon. Neuro Magnesium   valACYclovir (VALTREX) 1000 MG tablet Take 1 tablet (1,000 mg total) by mouth 2 (two) times daily. Must keep scheduled appt for future refills   Vitamin D-Vitamin K (VITAMIN K2-VITAMIN D3 PO) Take 1 capsule by mouth daily in the afternoon.   zolpidem (AMBIEN) 10 MG tablet TAKE 1 TABLET BY MOUTH EVERY DAY AT BEDTIME AS NEEDED FOR SLEEP     Allergies:   Penicillins   Social History   Socioeconomic History   Marital status: Single    Spouse name: seperated   Number of children: 2   Years of education: Not on file   Highest  education level: Not on file  Occupational History   Occupation: insurance  Tobacco Use   Smoking status: Never   Smokeless tobacco: Never  Substance and Sexual Activity   Alcohol use: Yes    Comment: social   Drug use: No   Sexual activity: Not on file  Other Topics Concern   Not on file  Social History Narrative   Not on file   Social Determinants of Health   Financial Resource Strain: Not on file  Food Insecurity: Not on file  Transportation Needs: Not on file  Physical Activity: Not on file  Stress: Not on file  Social Connections: Not on file     Family History: The patient's family history includes  Hypertension in her father and unknown relative.  ROS:   Please see the history of present illness.     All other systems reviewed and are negative.  EKGs/Labs/Other Studies Reviewed:    The following studies were reviewed today: Coronary calcium score 05/09/2022  EKG:  EKG is  ordered today.  The ekg ordered today demonstrates normal sinus rhythm, normal tracing  Recent Labs: 05/30/2022: ALT 11; BUN 14; Creatinine, Ser 0.78; Hemoglobin 13.5; Platelets 302.0; Potassium 4.0; Sodium 136; TSH 0.65  Recent Lipid Panel    Component Value Date/Time   CHOL 212 (H) 05/30/2022 1512   TRIG 44.0 05/30/2022 1512   HDL 73.20 05/30/2022 1512   CHOLHDL 3 05/30/2022 1512   VLDL 8.8 05/30/2022 1512   LDLCALC 130 (H) 05/30/2022 1512   LDLCALC 142 (H) 05/06/2020 1214   LDLDIRECT 101.3 03/08/2012 0947     Risk Assessment/Calculations:                Physical Exam:    VS:  BP 122/76 (BP Location: Left Arm, Patient Position: Sitting, Cuff Size: Normal)   Pulse 71   Ht '5\' 7"'$  (1.702 m)   Wt 62.6 kg   SpO2 96%   BMI 21.61 kg/m     Wt Readings from Last 3 Encounters:  07/12/22 62.6 kg  05/30/22 63.5 kg  09/30/21 65.8 kg     GEN: Appears slender and fit, well nourished, well developed in no acute distress HEENT: Normal NECK: No JVD; No carotid bruits LYMPHATICS: No lymphadenopathy CARDIAC: RRR, no murmurs, rubs, gallops RESPIRATORY:  Clear to auscultation without rales, wheezing or rhonchi  ABDOMEN: Soft, non-tender, non-distended MUSCULOSKELETAL:  No edema; No deformity  SKIN: Warm and dry NEUROLOGIC:  Alert and oriented x 3 PSYCHIATRIC:  Normal affect   ASSESSMENT:    1. Hypercholesterolemia   2. Elevated coronary artery calcium score    PLAN:    In order of problems listed above:  Elevated coronary calcium score: We reviewed the fact that this test has prognostic value not diagnostic value.  Adding the coronary calcium to her conventional risk factors essentially  doubles her risk of coronary events in the next 10 years, but this remains low at about 2.5%.  She is firmly against taking statin medications, although I have asked her to do additional research and reconsider.   HLP: This is really her only coronary risk factor.  My advice would be to take a lipid-lowering medicine chance to improve her lipid profile.  Statin would be the first choice, although we do have other agents (unfortunately unlikely to get insurance coverage for primary prevention at this point).  Regardless, there are ways that she can improve her diet (reduce the intake of beef and pork products and dairy; increased  intake of fish, oils, nuts) and increase physical activity to a minimum of 3 hours a week, out of which 30 minutes should be intense physical activity such as lifting weights, aerobics, etc.  Repeat a lipid profile in a year and a calcium score in several years.  There is increasing scientific data that the duration of exposure to elevated cholesterol is more protective than intense cholesterol reduction after events have already occurred.           Medication Adjustments/Labs and Tests Ordered: Current medicines are reviewed at length with the patient today.  Concerns regarding medicines are outlined above.  Orders Placed This Encounter  Procedures   EKG 12-Lead   No orders of the defined types were placed in this encounter.   Patient Instructions  Medication Instructions:  No change   Lab Work: None ordered   Testing/Procedures: None ordered   Follow-Up: At [facility], you and your health needs are our priority.  As part of our continuing mission to provide you with exceptional heart care, we have created designated Provider Care Teams.  These Care Teams include your primary Cardiologist (physician) and Advanced Practice Providers (APPs -  Physician Assistants and Nurse Practitioners) who all work together to provide you with the care you need,  when you need it.  We recommend signing up for the patient portal called "MyChart".  Sign up information is provided on this After Visit Summary.  MyChart is used to connect with patients for Virtual Visits (Telemedicine).  Patients are able to view lab/test results, encounter notes, upcoming appointments, etc.  Non-urgent messages can be sent to your provider as well.   To learn more about what you can do with MyChart, go to [email or web address].    Your next appointment:  As Needed    The format for your next appointment: Office   Provider:  Dr.Candas Deemer   Important Information About Sugar         Signed, Sanda Klein, MD  07/13/2022 2:15 PM    [place]

## 2022-07-12 NOTE — Patient Instructions (Signed)
Medication Instructions:  No change   Lab Work: None ordered   Testing/Procedures: None ordered   Follow-Up: At [facility], you and your health needs are our priority.  As part of our continuing mission to provide you with exceptional heart care, we have created designated Provider Care Teams.  These Care Teams include your primary Cardiologist (physician) and Advanced Practice Providers (APPs -  Physician Assistants and Nurse Practitioners) who all work together to provide you with the care you need, when you need it.  We recommend signing up for the patient portal called "MyChart".  Sign up information is provided on this After Visit Summary.  MyChart is used to connect with patients for Virtual Visits (Telemedicine).  Patients are able to view lab/test results, encounter notes, upcoming appointments, etc.  Non-urgent messages can be sent to your provider as well.   To learn more about what you can do with MyChart, go to [email or web address].    Your next appointment:  As Needed    The format for your next appointment: Office   Provider:  Dr.Croitoru   Important Information About Sugar

## 2022-09-01 ENCOUNTER — Telehealth: Payer: Self-pay | Admitting: Internal Medicine

## 2022-09-01 NOTE — Telephone Encounter (Signed)
Patient is calling concerning a bill for date of service 05/30/2022 - She said that it has been coded wrong and she is being charged to much for her labs.  She has spoke to billing and they advised her to call the office.  Patients number:  [phone]

## 2022-09-05 ENCOUNTER — Encounter: Payer: Self-pay | Admitting: Internal Medicine

## 2022-09-05 MED ORDER — VALACYCLOVIR HCL 1 G PO TABS: 1000.0000 mg | ORAL_TABLET | Freq: Two times a day (BID) | ORAL | 2 refills | Status: DC

## 2022-09-05 MED ORDER — ZOLPIDEM TARTRATE 10 MG PO TABS: ORAL_TABLET | ORAL | 1 refills | Status: DC

## 2022-09-05 NOTE — Telephone Encounter (Signed)
LOV 05/30/22

## 2022-09-15 NOTE — Telephone Encounter (Signed)
PT calls back again regarding this matter. I informed PT that it looked like the only test included this time that was not on the labs from last year was the PTH intact and calcium.  PT stated that it looked as though none of the labs were coded to be part of PT's physical. Double checked to make sure PT was due for their physical (and she was). PT has tried to reach out to both her insurance and our billing office with no new progress. Stated she would like to speak to upper management or whoever would be able to get the visit coding fixed and resent to the insurance.  CB: [phone]

## 2022-09-21 NOTE — Telephone Encounter (Signed)
Patient states that the insurance is not covering any of the labs that were done as part of her well visit, and this is the first year that this has happened. I have asked her to email me the statement from the insurance company. Were the labs not entered for wellness check?

## 2022-10-11 ENCOUNTER — Encounter: Payer: Self-pay | Admitting: Internal Medicine

## 2022-10-11 NOTE — Telephone Encounter (Signed)
Pls advise on email.Marland KitchenJohny Chess

## 2023-01-09 ENCOUNTER — Encounter: Payer: Self-pay | Admitting: Internal Medicine

## 2023-01-09 ENCOUNTER — Ambulatory Visit: Payer: 59 | Admitting: Internal Medicine

## 2023-01-09 VITALS — BP 122/80 | HR 60 | Temp 99.2°F | Ht 67.0 in | Wt 144.0 lb

## 2023-01-09 DIAGNOSIS — E559 Vitamin D deficiency, unspecified: Secondary | ICD-10-CM | POA: Diagnosis not present

## 2023-01-09 DIAGNOSIS — F411 Generalized anxiety disorder: Secondary | ICD-10-CM | POA: Diagnosis not present

## 2023-01-09 DIAGNOSIS — M542 Cervicalgia: Secondary | ICD-10-CM | POA: Diagnosis not present

## 2023-01-09 DIAGNOSIS — E538 Deficiency of other specified B group vitamins: Secondary | ICD-10-CM | POA: Diagnosis not present

## 2023-01-09 DIAGNOSIS — E7800 Pure hypercholesterolemia, unspecified: Secondary | ICD-10-CM

## 2023-01-09 DIAGNOSIS — R739 Hyperglycemia, unspecified: Secondary | ICD-10-CM | POA: Diagnosis not present

## 2023-01-09 NOTE — Patient Instructions (Addendum)
Please consider Zetia 10 mg, Repatha 140 mg, Nexlitol or Nexlizet for high cholesterol  Ok to follow the neck pain for now, ok for tylenol as needed  Please continue all other medications as before, and refills have been done if requested.  Please have the pharmacy call with any other refills you may need.  Please continue your efforts at being more active, low cholesterol diet, and weight control.  Please keep your appointments with your specialists as you may have planned  Please make an Appointment to return in Jun 01, 2023, or sooner if needed, also with Lab Appointment for testing done 3-5 days before at the FIRST FLOOR Lab (so this is for TWO appointments - please see the scheduling desk as you leave)

## 2023-01-09 NOTE — Progress Notes (Unsigned)
Patient ID: [name], female   DOB: [birth year], 52 y.o.   MRN: [id number]        Chief Complaint: follow up right anterior neck pain, hld, anxiety       HPI:  [name] is a 52 y.o. female here with c/o right anterior neck pain with rubber band like twinge pain for seconds only, mild, only seems to occur with looking down and to the right in a certain way, no other pain, swelling, fever, chills, ST, cough, mass or voice change.  Pt denies chest pain, increased sob or doe, wheezing, orthopnea, PND, increased LE swelling, palpitations, dizziness or syncope.   Pt denies polydipsia, polyuria, or new focal neuro s/s.    Pt denies fever, wt loss, night sweats, loss of appetite, or other constitutional symptoms  Denies worsening depressive symptoms, suicidal ideation, or panic       Wt Readings from Last 3 Encounters:  01/09/23 144 lb (65.3 kg)  07/12/22 138 lb (62.6 kg)  05/30/22 140 lb (63.5 kg)   BP Readings from Last 3 Encounters:  01/09/23 122/80  07/12/22 122/76  05/30/22 120/66         Past Medical History:  Diagnosis Date   ALLERGIC RHINITIS 10/25/2007   ANXIETY 10/05/2009   ANXIETY DEPRESSION 10/25/2007   DEPRESSION 10/05/2009   GERD (gastroesophageal reflux disease)    occasional-tums prn   MIGRAINE HEADACHE 10/25/2007   Past Surgical History:  Procedure Laterality Date   IUD REMOVAL  07/29/2011   Procedure: INTRAUTERINE DEVICE (IUD) REMOVAL;  Surgeon: Juluis Mire;  Location: [facility];  Service: Gynecology;  Laterality: N/A;   LAPAROSCOPIC TUBAL LIGATION  07/29/2011   Procedure: LAPAROSCOPIC TUBAL LIGATION;  Surgeon: Juluis Mire;  Location: [facility];  Service: Gynecology;  Laterality: Bilateral;   NO PAST SURGERIES     TUBAL LIGATION      reports that she has never smoked. She has never used smokeless tobacco. She reports current alcohol use. She reports that she does not use drugs. family history includes Hypertension in her father and unknown relative. Allergies   Allergen Reactions   Penicillins     Childhood reaction - rash    Current Outpatient Medications on File Prior to Visit  Medication Sig Dispense Refill   Ascorbic Acid (VITAMIN C) 1000 MG tablet Take 1,000 mg by mouth daily.     B Complex-C (B-COMPLEX WITH VITAMIN C) tablet Take 1 tablet by mouth daily.     citalopram (CELEXA) 10 MG tablet Take 1 tablet (10 mg total) by mouth daily. 90 tablet 3   OVER THE COUNTER MEDICATION Take 1 capsule by mouth daily in the afternoon. Neuro Magnesium     triamcinolone cream (KENALOG) 0.1 % Apply 1 application topically 2 (two) times daily. 30 g 0   valACYclovir (VALTREX) 1000 MG tablet Take 1 tablet (1,000 mg total) by mouth 2 (two) times daily. 90 tablet 2   Vitamin D-Vitamin K (VITAMIN K2-VITAMIN D3 PO) Take 1 capsule by mouth daily in the afternoon.     zolpidem (AMBIEN) 10 MG tablet TAKE 1 TABLET BY MOUTH EVERY DAY AT BEDTIME AS NEEDED FOR SLEEP 90 tablet 1   fluconazole (DIFLUCAN) 150 MG tablet Take 1 tablet by oral route for 1 day. (Patient not taking: Reported on 07/12/2022)     metroNIDAZOLE (METROGEL) 0.75 % vaginal gel Place vaginally. (Patient not taking: Reported on 07/12/2022)     No current facility-administered medications on file prior to visit.  ROS:  All others reviewed and negative.  Objective        PE:  BP 122/80 (BP Location: Left Arm, Patient Position: Sitting, Cuff Size: Normal)   Pulse 60   Temp 99.2 F (37.3 C) (Oral)   Ht 5\' 7"  (1.702 m)   Wt 144 lb (65.3 kg)   SpO2 99%   BMI 22.55 kg/m                 Constitutional: Pt appears in NAD               HENT: Head: NCAT.                Right Ear: External ear normal.                 Left Ear: External ear normal.                Eyes: . Pupils are equal, round, and reactive to light. Conjunctivae and EOM are normal               Nose: without d/c or deformity               Neck: Neck supple. Gross normal ROM               Cardiovascular: Normal rate and  regular rhythm.                 Pulmonary/Chest: Effort normal and breath sounds without rales or wheezing.                Abd:  Soft, NT, ND, + BS, no organomegaly               Neurological: Pt is alert. At baseline orientation, motor grossly intact               Skin: Skin is warm. No rashes, no other new lesions, LE edema - none               Psychiatric: Pt behavior is normal without agitation   Micro: none  Cardiac tracings I have personally interpreted today:  none  Pertinent Radiological findings (summarize): none   Lab Results  Component Value Date   WBC 5.7 05/30/2022   HGB 13.5 05/30/2022   HCT 38.9 05/30/2022   PLT 302.0 05/30/2022   GLUCOSE 83 05/30/2022   CHOL 212 (H) 05/30/2022   TRIG 44.0 05/30/2022   HDL 73.20 05/30/2022   LDLDIRECT 101.3 03/08/2012   LDLCALC 130 (H) 05/30/2022   ALT 11 05/30/2022   AST 17 05/30/2022   NA 136 05/30/2022   K 4.0 05/30/2022   CL 102 05/30/2022   CREATININE 0.78 05/30/2022   BUN 14 05/30/2022   CO2 27 05/30/2022   TSH 0.65 05/30/2022   HGBA1C 5.5 05/30/2022   Assessment/Plan:  [name] is a 52 y.o. White or Caucasian [1] female with  has a past medical history of ALLERGIC RHINITIS (10/25/2007), ANXIETY (10/05/2009), ANXIETY DEPRESSION (10/25/2007), DEPRESSION (10/05/2009), GERD (gastroesophageal reflux disease), and MIGRAINE HEADACHE (10/25/2007).  HYPERCHOLESTEROLEMIA Lab Results  Component Value Date   LDLCALC 130 (H) 05/30/2022   Uncontroleld, goal ldl < 100. pt to continue with lower chol diet, declines statin for now  Anxiety state Mild chronic, declines need for med tx for now  Neck pain on right side Exam benign, suspect benign mild msk abnormality related, does not appear to have functional abnormality or c spine issue, hold on  imaging for, now, for tylenol prn  Followup: Return in about 5 months (around 06/01/2023).  Oliver Barre, MD 01/11/2023 9:16 PM [facility]

## 2023-01-11 ENCOUNTER — Encounter: Payer: Self-pay | Admitting: Internal Medicine

## 2023-01-11 NOTE — Assessment & Plan Note (Signed)
Exam benign, suspect benign mild msk abnormality related, does not appear to have functional abnormality or c spine issue, hold on imaging for, now, for tylenol prn

## 2023-01-11 NOTE — Assessment & Plan Note (Signed)
Mild chronic, declines need for med tx for now

## 2023-01-11 NOTE — Assessment & Plan Note (Signed)
Lab Results  Component Value Date   LDLCALC 130 (H) 05/30/2022   Uncontroleld, goal ldl < 100. pt to continue with lower chol diet, declines statin for now

## 2023-03-21 DIAGNOSIS — Z113 Encounter for screening for infections with a predominantly sexual mode of transmission: Secondary | ICD-10-CM | POA: Diagnosis not present

## 2023-03-21 DIAGNOSIS — Z6822 Body mass index (BMI) 22.0-22.9, adult: Secondary | ICD-10-CM | POA: Diagnosis not present

## 2023-03-21 DIAGNOSIS — Z1231 Encounter for screening mammogram for malignant neoplasm of breast: Secondary | ICD-10-CM | POA: Diagnosis not present

## 2023-03-21 DIAGNOSIS — Z01419 Encounter for gynecological examination (general) (routine) without abnormal findings: Secondary | ICD-10-CM | POA: Diagnosis not present

## 2023-03-21 DIAGNOSIS — E8941 Symptomatic postprocedural ovarian failure: Secondary | ICD-10-CM | POA: Diagnosis not present

## 2023-04-12 DIAGNOSIS — R87612 Low grade squamous intraepithelial lesion on cytologic smear of cervix (LGSIL): Secondary | ICD-10-CM | POA: Diagnosis not present

## 2023-04-18 ENCOUNTER — Ambulatory Visit: Payer: 59 | Admitting: Internal Medicine

## 2023-05-23 DIAGNOSIS — R87810 Cervical high risk human papillomavirus (HPV) DNA test positive: Secondary | ICD-10-CM | POA: Diagnosis not present

## 2023-05-23 DIAGNOSIS — R87612 Low grade squamous intraepithelial lesion on cytologic smear of cervix (LGSIL): Secondary | ICD-10-CM | POA: Diagnosis not present

## 2023-06-01 ENCOUNTER — Encounter: Payer: Self-pay | Admitting: Internal Medicine

## 2023-06-01 ENCOUNTER — Other Ambulatory Visit: Payer: Self-pay | Admitting: Internal Medicine

## 2023-06-01 ENCOUNTER — Ambulatory Visit (INDEPENDENT_AMBULATORY_CARE_PROVIDER_SITE_OTHER): Payer: 59 | Admitting: Internal Medicine

## 2023-06-01 VITALS — BP 120/72 | HR 65 | Temp 98.8°F | Ht 67.0 in | Wt 148.0 lb

## 2023-06-01 DIAGNOSIS — R739 Hyperglycemia, unspecified: Secondary | ICD-10-CM

## 2023-06-01 DIAGNOSIS — E538 Deficiency of other specified B group vitamins: Secondary | ICD-10-CM | POA: Diagnosis not present

## 2023-06-01 DIAGNOSIS — Z0001 Encounter for general adult medical examination with abnormal findings: Secondary | ICD-10-CM

## 2023-06-01 DIAGNOSIS — Z0000 Encounter for general adult medical examination without abnormal findings: Secondary | ICD-10-CM

## 2023-06-01 DIAGNOSIS — E7800 Pure hypercholesterolemia, unspecified: Secondary | ICD-10-CM

## 2023-06-01 DIAGNOSIS — E559 Vitamin D deficiency, unspecified: Secondary | ICD-10-CM | POA: Diagnosis not present

## 2023-06-01 DIAGNOSIS — Z1159 Encounter for screening for other viral diseases: Secondary | ICD-10-CM | POA: Diagnosis not present

## 2023-06-01 LAB — CBC WITH DIFFERENTIAL/PLATELET
Basophils Absolute: 0.1 10*3/uL (ref 0.0–0.1)
Basophils Relative: 0.9 % (ref 0.0–3.0)
Eosinophils Absolute: 0.2 10*3/uL (ref 0.0–0.7)
Eosinophils Relative: 2.4 % (ref 0.0–5.0)
HCT: 40.4 % (ref 36.0–46.0)
Hemoglobin: 13.6 g/dL (ref 12.0–15.0)
Lymphocytes Relative: 24.8 % (ref 12.0–46.0)
Lymphs Abs: 1.6 10*3/uL (ref 0.7–4.0)
MCHC: 33.5 g/dL (ref 30.0–36.0)
MCV: 89 fL (ref 78.0–100.0)
Monocytes Absolute: 0.4 10*3/uL (ref 0.1–1.0)
Monocytes Relative: 5.7 % (ref 3.0–12.0)
Neutro Abs: 4.3 10*3/uL (ref 1.4–7.7)
Neutrophils Relative %: 66.2 % (ref 43.0–77.0)
Platelets: 237 10*3/uL (ref 150.0–400.0)
RBC: 4.54 Mil/uL (ref 3.87–5.11)
RDW: 13 % (ref 11.5–15.5)
WBC: 6.6 10*3/uL (ref 4.0–10.5)

## 2023-06-01 LAB — HEPATIC FUNCTION PANEL
ALT: 10 U/L (ref 0–35)
AST: 16 U/L (ref 0–37)
Albumin: 4.3 g/dL (ref 3.5–5.2)
Alkaline Phosphatase: 46 U/L (ref 39–117)
Bilirubin, Direct: 0.1 mg/dL (ref 0.0–0.3)
Total Bilirubin: 0.6 mg/dL (ref 0.2–1.2)
Total Protein: 7.2 g/dL (ref 6.0–8.3)

## 2023-06-01 LAB — LIPID PANEL
Cholesterol: 243 mg/dL — ABNORMAL HIGH (ref 0–200)
HDL: 94.7 mg/dL (ref >39.00)
LDL Cholesterol: 138 mg/dL — ABNORMAL HIGH (ref 0–99)
NonHDL: 147.85
Total CHOL/HDL Ratio: 3
Triglycerides: 48 mg/dL (ref 0.0–149.0)
VLDL: 9.6 mg/dL (ref 0.0–40.0)

## 2023-06-01 LAB — BASIC METABOLIC PANEL
BUN: 20 mg/dL (ref 6–23)
CO2: 29 meq/L (ref 19–32)
Calcium: 9.3 mg/dL (ref 8.4–10.5)
Chloride: 102 meq/L (ref 96–112)
Creatinine, Ser: 0.87 mg/dL (ref 0.40–1.20)
GFR: 76.88 mL/min (ref >60.00)
Glucose, Bld: 96 mg/dL (ref 70–99)
Potassium: 4.5 meq/L (ref 3.5–5.1)
Sodium: 137 meq/L (ref 135–145)

## 2023-06-01 LAB — URINALYSIS, ROUTINE W REFLEX MICROSCOPIC
Bilirubin Urine: NEGATIVE
Ketones, ur: NEGATIVE
Nitrite: NEGATIVE
Specific Gravity, Urine: 1.02 (ref 1.000–1.030)
Total Protein, Urine: NEGATIVE
Urine Glucose: NEGATIVE
Urobilinogen, UA: 0.2 (ref 0.0–1.0)
pH: 6 (ref 5.0–8.0)

## 2023-06-01 LAB — TSH: TSH: 0.74 u[IU]/mL (ref 0.35–5.50)

## 2023-06-01 LAB — VITAMIN B12: Vitamin B-12: 594 pg/mL (ref 211–911)

## 2023-06-01 LAB — VITAMIN D 25 HYDROXY (VIT D DEFICIENCY, FRACTURES): VITD: 63.31 ng/mL (ref 30.00–100.00)

## 2023-06-01 LAB — HEPATITIS C ANTIBODY: Hepatitis C Ab: NONREACTIVE

## 2023-06-01 LAB — HEMOGLOBIN A1C: Hgb A1c MFr Bld: 5.3 % (ref 4.6–6.5)

## 2023-06-01 MED ORDER — VALACYCLOVIR HCL 1 G PO TABS: 1000.0000 mg | ORAL_TABLET | Freq: Two times a day (BID) | ORAL | 2 refills | Status: DC

## 2023-06-01 MED ORDER — NITROFURANTOIN MACROCRYSTAL 50 MG PO CAPS: 50.0000 mg | ORAL_CAPSULE | Freq: Two times a day (BID) | ORAL | 0 refills | Status: AC

## 2023-06-01 MED ORDER — ROSUVASTATIN CALCIUM 5 MG PO TABS
5.0000 mg | ORAL_TABLET | Freq: Every day | ORAL | 3 refills | Status: DC
Start: 2023-06-01 — End: 2024-07-04

## 2023-06-01 MED ORDER — ZOLPIDEM TARTRATE 10 MG PO TABS: ORAL_TABLET | ORAL | 1 refills | Status: AC

## 2023-06-01 MED ORDER — CITALOPRAM HYDROBROMIDE 10 MG PO TABS: 10.0000 mg | ORAL_TABLET | Freq: Every day | ORAL | 3 refills | Status: DC

## 2023-06-01 NOTE — Patient Instructions (Addendum)
Please have your Shingrix (shingles) shots done at your local pharmacy.  Please take all new medication as prescribed - the crestor 5 mg per day  Please continue all other medications as before, and refills have been done if requested.  Please have the pharmacy call with any other refills you may need.  Please continue your efforts at being more active, low cholesterol diet, and weight control.  You are otherwise up to date with prevention measures today.  Please keep your appointments with your specialists as you may have planned  Please go to the LAB at the blood drawing area for the tests to be done  You will be contacted by phone if any changes need to be made immediately.  Otherwise, you will receive a letter about your results with an explanation, but please check with MyChart first.  Please make an Appointment to return for your 1 year visit, or sooner if needed

## 2023-06-01 NOTE — Progress Notes (Signed)
Patient ID: [name], female   DOB: [birth year], 52 y.o.   MRN: [id number]         Chief Complaint:: wellness exam and hld       HPI:  [name] is a 52 y.o. female here for wellness exam; or shingrix at pharmacy, to see GYN soon for pap, delcines flu shot and colonoscopy for now, o/w up to date               Also Pt denies chest pain, increased sob or doe, wheezing, orthopnea, PND, increased LE swelling, palpitations, dizziness or syncope.   Pt denies polydipsia, polyuria, or new focal neuro s/s.    Pt denies fever, wt loss, night sweats, loss of appetite, or other constitutional symptoms     Wt Readings from Last 3 Encounters:  06/01/23 148 lb (67.1 kg)  01/09/23 144 lb (65.3 kg)  07/12/22 138 lb (62.6 kg)   BP Readings from Last 3 Encounters:  06/01/23 120/72  01/09/23 122/80  07/12/22 122/76   Immunization History  Administered Date(s) Administered   Td 01/29/2007   Td (Adult),5 Lf Tetanus Toxid, Preservative Free 01/29/2007   Tdap 12/01/2017   Health Maintenance Due  Topic Date Due   Zoster Vaccines- Shingrix (1 of 2) Never done      Past Medical History:  Diagnosis Date   ALLERGIC RHINITIS 10/25/2007   ANXIETY 10/05/2009   ANXIETY DEPRESSION 10/25/2007   DEPRESSION 10/05/2009   GERD (gastroesophageal reflux disease)    occasional-tums prn   MIGRAINE HEADACHE 10/25/2007   Past Surgical History:  Procedure Laterality Date   IUD REMOVAL  07/29/2011   Procedure: INTRAUTERINE DEVICE (IUD) REMOVAL;  Surgeon: Juluis Mire;  Location: [facility];  Service: Gynecology;  Laterality: N/A;   LAPAROSCOPIC TUBAL LIGATION  07/29/2011   Procedure: LAPAROSCOPIC TUBAL LIGATION;  Surgeon: Juluis Mire;  Location: [facility];  Service: Gynecology;  Laterality: Bilateral;   NO PAST SURGERIES     TUBAL LIGATION      reports that she has never smoked. She has never used smokeless tobacco. She reports current alcohol use. She reports that she does not use drugs. family history includes  Hypertension in her father and unknown relative. Allergies  Allergen Reactions   Penicillins     Childhood reaction - rash    Current Outpatient Medications on File Prior to Visit  Medication Sig Dispense Refill   Ascorbic Acid (VITAMIN C) 1000 MG tablet Take 1,000 mg by mouth daily.     B Complex-C (B-COMPLEX WITH VITAMIN C) tablet Take 1 tablet by mouth daily.     OVER THE COUNTER MEDICATION Take 1 capsule by mouth daily in the afternoon. Neuro Magnesium     triamcinolone cream (KENALOG) 0.1 % Apply 1 application topically 2 (two) times daily. 30 g 0   Vitamin D-Vitamin K (VITAMIN K2-VITAMIN D3 PO) Take 1 capsule by mouth daily in the afternoon.     fluconazole (DIFLUCAN) 150 MG tablet Take 1 tablet by oral route for 1 day. (Patient not taking: Reported on 07/12/2022)     metroNIDAZOLE (METROGEL) 0.75 % vaginal gel Place vaginally. (Patient not taking: Reported on 07/12/2022)     No current facility-administered medications on file prior to visit.        ROS:  All others reviewed and negative.  Objective        PE:  BP 120/72 (BP Location: Right Arm, Patient Position: Sitting, Cuff Size: Normal)   Pulse 65  Temp 98.8 F (37.1 C) (Oral)   Ht 5\' 7"  (1.702 m)   Wt 148 lb (67.1 kg)   LMP 01/03/2023 (Approximate)   SpO2 100%   BMI 23.18 kg/m                 Constitutional: Pt appears in NAD               HENT: Head: NCAT.                Right Ear: External ear normal.                 Left Ear: External ear normal.                Eyes: . Pupils are equal, round, and reactive to light. Conjunctivae and EOM are normal               Nose: without d/c or deformity               Neck: Neck supple. Gross normal ROM               Cardiovascular: Normal rate and regular rhythm.                 Pulmonary/Chest: Effort normal and breath sounds without rales or wheezing.                Abd:  Soft, NT, ND, + BS, no organomegaly               Neurological: Pt is alert. At baseline  orientation, motor grossly intact               Skin: Skin is warm. No rashes, no other new lesions, LE edema - none               Psychiatric: Pt behavior is normal without agitation   Micro: none  Cardiac tracings I have personally interpreted today:  none  Pertinent Radiological findings (summarize): none   Lab Results  Component Value Date   WBC 6.6 06/01/2023   HGB 13.6 06/01/2023   HCT 40.4 06/01/2023   PLT 237.0 06/01/2023   GLUCOSE 96 06/01/2023   CHOL 243 (H) 06/01/2023   TRIG 48.0 06/01/2023   HDL 94.70 06/01/2023   LDLDIRECT 101.3 03/08/2012   LDLCALC 138 (H) 06/01/2023   ALT 10 06/01/2023   AST 16 06/01/2023   NA 137 06/01/2023   K 4.5 06/01/2023   CL 102 06/01/2023   CREATININE 0.87 06/01/2023   BUN 20 06/01/2023   CO2 29 06/01/2023   TSH 0.74 06/01/2023   HGBA1C 5.3 06/01/2023   Assessment/Plan:  [name] is a 52 y.o. White or Caucasian [1] female with  has a past medical history of ALLERGIC RHINITIS (10/25/2007), ANXIETY (10/05/2009), ANXIETY DEPRESSION (10/25/2007), DEPRESSION (10/05/2009), GERD (gastroesophageal reflux disease), and MIGRAINE HEADACHE (10/25/2007).  Encounter for well adult exam with abnormal findings Age and sex appropriate education and counseling updated with regular exercise and diet Referrals for preventative services - declines colonoscopy, to see GYN soon for pap Immunizations addressed - declines flu shot, for shingrix at pharmacy Smoking counseling  - none needed Evidence for depression or other mood disorder - none significant Most recent labs reviewed. I have personally reviewed and have noted: 1) the patient's medical and social history 2) The patient's current medications and supplements 3) The patient's height, weight, and BMI have been recorded in the chart   HYPERCHOLESTEROLEMIA  Lab Results  Component Value Date   LDLCALC 138 (H) 06/01/2023   Uncontrolled, , pt to start crestor 5 mg every day, lower chol  diet  Followup: Return in about 1 year (around 05/31/2024).  Oliver Barre, MD 06/04/2023 6:29 PM [facility]

## 2023-06-04 ENCOUNTER — Encounter: Payer: Self-pay | Admitting: Internal Medicine

## 2023-06-04 NOTE — Assessment & Plan Note (Signed)
Age and sex appropriate education and counseling updated with regular exercise and diet Referrals for preventative services - declines colonoscopy, to see GYN soon for pap Immunizations addressed - declines flu shot, for shingrix at pharmacy Smoking counseling  - none needed Evidence for depression or other mood disorder - none significant Most recent labs reviewed. I have personally reviewed and have noted: 1) the patient's medical and social history 2) The patient's current medications and supplements 3) The patient's height, weight, and BMI have been recorded in the chart

## 2023-06-04 NOTE — Assessment & Plan Note (Signed)
Lab Results  Component Value Date   LDLCALC 138 (H) 06/01/2023   Uncontrolled, , pt to start crestor 5 mg every day, lower chol diet

## 2023-06-07 DIAGNOSIS — Z4889 Encounter for other specified surgical aftercare: Secondary | ICD-10-CM | POA: Diagnosis not present

## 2023-06-07 DIAGNOSIS — Z09 Encounter for follow-up examination after completed treatment for conditions other than malignant neoplasm: Secondary | ICD-10-CM | POA: Diagnosis not present

## 2023-06-30 ENCOUNTER — Telehealth: Payer: Self-pay | Admitting: Internal Medicine

## 2023-06-30 NOTE — Telephone Encounter (Signed)
Patient called and said she was charged for her labs when seen for her physical on 06/01/2023. She said this was an issue last year as well. It was an issue with how it was billed last year. Patient said she didn't want a repeat of last year and would like to speak with a manager about this to get it fixed. Patient would like a call back from [place] at [phone].

## 2023-08-07 NOTE — Telephone Encounter (Signed)
Spoke with patient and she is aware of the following: - all labs except for Lipid panel on 10.3.24 have been voided with a note.  Patient responsibility is $25.64 for the lipid panel.     Thanks for reaching out.     Tonita Cong  [facility]   [facility]

## 2024-02-14 ENCOUNTER — Encounter: Payer: Self-pay | Admitting: Podiatry

## 2024-02-14 ENCOUNTER — Ambulatory Visit (INDEPENDENT_AMBULATORY_CARE_PROVIDER_SITE_OTHER): Payer: Self-pay | Admitting: Podiatry

## 2024-02-14 ENCOUNTER — Ambulatory Visit (INDEPENDENT_AMBULATORY_CARE_PROVIDER_SITE_OTHER)

## 2024-02-14 DIAGNOSIS — M722 Plantar fascial fibromatosis: Secondary | ICD-10-CM

## 2024-02-14 MED ORDER — TRIAMCINOLONE ACETONIDE 10 MG/ML IJ SUSP
10.0000 mg | Freq: Once | INTRAMUSCULAR | Status: AC
Start: 2024-02-14 — End: 2024-02-14
  Administered 2024-02-14: 10 mg via INTRA_ARTICULAR

## 2024-02-14 MED ORDER — DICLOFENAC SODIUM 75 MG PO TBEC: 75.0000 mg | DELAYED_RELEASE_TABLET | Freq: Two times a day (BID) | ORAL | 2 refills | Status: AC

## 2024-02-14 NOTE — Patient Instructions (Signed)

## 2024-02-15 NOTE — Progress Notes (Signed)
 Subjective:   Patient ID: [name], female   DOB: 53 y.o.   MRN: [id number]   HPI Patient presents with severe pain in the plantar heel of both feet right over left that has intensified over the last 6 months.  States it is very sore makes it hard to walk and that she has tried different shoe gear modifications.  Patient does not smoke likes to be active   Review of Systems  All other systems reviewed and are negative.       Objective:  Physical Exam Vitals and nursing note reviewed.  Constitutional:      Appearance: She is well-developed.  Pulmonary:     Effort: Pulmonary effort is normal.   Musculoskeletal:        General: Normal range of motion.   Skin:    General: Skin is warm.   Neurological:     Mental Status: She is alert.     Neurovascular status intact muscle strength found to be adequate range of motion within normal limits with exquisite discomfort noted medial fascial band right and left with fluid buildup at the tendon insertion calcaneus bilateral with the right being worse.  Patient is found to have good digital perfusion well-oriented     Assessment:  Acute plantar fasciitis right over left with pain     Plan:  H&P reviewed and x-rays reviewed.  Sterile prep injected the medial band of the fascia bilateral 3 mg Kenalog  5 mg Xylocaine  applied sterile dressings and for the right that is much worse I applied fascial brace to lift up the arch properly fitted into the arch.  Gave instructions for stretching exercises shoe gear modifications reappoint in 2 weeks and placed on oral anti-inflammatory diclofenac  X-rays indicate small spur formation no indication stress fracture arthritis moderate depression of the arch

## 2024-02-28 ENCOUNTER — Ambulatory Visit: Admitting: Podiatry

## 2024-03-07 ENCOUNTER — Ambulatory Visit: Admitting: Podiatry

## 2024-05-21 ENCOUNTER — Encounter: Payer: Self-pay | Admitting: Internal Medicine

## 2024-05-21 DIAGNOSIS — L989 Disorder of the skin and subcutaneous tissue, unspecified: Secondary | ICD-10-CM

## 2024-05-31 ENCOUNTER — Encounter: Payer: Self-pay | Admitting: Radiology

## 2024-05-31 ENCOUNTER — Encounter: Payer: 59 | Admitting: Internal Medicine

## 2024-05-31 ENCOUNTER — Other Ambulatory Visit (HOSPITAL_COMMUNITY)
Admission: RE | Admit: 2024-05-31 | Discharge: 2024-05-31 | Disposition: A | Source: Ambulatory Visit | Attending: Radiology | Admitting: Radiology

## 2024-05-31 ENCOUNTER — Ambulatory Visit (INDEPENDENT_AMBULATORY_CARE_PROVIDER_SITE_OTHER): Admitting: Radiology

## 2024-05-31 VITALS — BP 118/74 | HR 67 | Ht 67.0 in | Wt 154.0 lb

## 2024-05-31 DIAGNOSIS — Z01419 Encounter for gynecological examination (general) (routine) without abnormal findings: Secondary | ICD-10-CM | POA: Diagnosis present

## 2024-05-31 DIAGNOSIS — Z9889 Other specified postprocedural states: Secondary | ICD-10-CM

## 2024-05-31 DIAGNOSIS — Z1331 Encounter for screening for depression: Secondary | ICD-10-CM

## 2024-05-31 DIAGNOSIS — N926 Irregular menstruation, unspecified: Secondary | ICD-10-CM

## 2024-05-31 LAB — FOLLICLE STIMULATING HORMONE: FSH: 32.7 m[IU]/mL

## 2024-05-31 NOTE — Patient Instructions (Signed)
 Preventive Care 58-53 Years Old, Female  Preventive care refers to lifestyle choices and visits with your health care provider that can promote health and wellness. Preventive care visits are also called wellness exams.  What can I expect for my preventive care visit?  Counseling  Your health care provider may ask you questions about your:  Medical history, including:  Past medical problems.  Family medical history.  Pregnancy history.  Current health, including:  Menstrual cycle.  Method of birth control.  Emotional well-being.  Home life and relationship well-being.  Sexual activity and sexual health.  Lifestyle, including:  Alcohol, nicotine or tobacco, and drug use.  Access to firearms.  Diet, exercise, and sleep habits.  Work and work Astronomer.  Sunscreen use.  Safety issues such as seatbelt and bike helmet use.  Physical exam  Your health care provider will check your:  Height and weight. These may be used to calculate your BMI (body mass index). BMI is a measurement that tells if you are at a healthy weight.  Waist circumference. This measures the distance around your waistline. This measurement also tells if you are at a healthy weight and may help predict your risk of certain diseases, such as type 2 diabetes and high blood pressure.  Heart rate and blood pressure.  Body temperature.  Skin for abnormal spots.  What immunizations do I need?    Vaccines are usually given at various ages, according to a schedule. Your health care provider will recommend vaccines for you based on your age, medical history, and lifestyle or other factors, such as travel or where you work.  What tests do I need?  Screening  Your health care provider may recommend screening tests for certain conditions. This may include:  Lipid and cholesterol levels.  Diabetes screening. This is done by checking your blood sugar (glucose) after you have not eaten for a while (fasting).  Pelvic exam and Pap test.  Hepatitis B test.  Hepatitis C  test.  HIV (human immunodeficiency virus) test.  STI (sexually transmitted infection) testing, if you are at risk.  Lung cancer screening.  Colorectal cancer screening.  Mammogram. Talk with your health care provider about when you should start having regular mammograms. This may depend on whether you have a family history of breast cancer.  BRCA-related cancer screening. This may be done if you have a family history of breast, ovarian, tubal, or peritoneal cancers.  Bone density scan. This is done to screen for osteoporosis.  Talk with your health care provider about your test results, treatment options, and if necessary, the need for more tests.  Follow these instructions at home:  Eating and drinking    Eat a diet that includes fresh fruits and vegetables, whole grains, lean protein, and low-fat dairy products.  Take vitamin and mineral supplements as recommended by your health care provider.  Do not drink alcohol if:  Your health care provider tells you not to drink.  You are pregnant, may be pregnant, or are planning to become pregnant.  If you drink alcohol:  Limit how much you have to 0-1 drink a day.  Know how much alcohol is in your drink. In the [place] one drink equals one 12 oz bottle of beer (355 mL), one 5 oz glass of wine (148 mL), or one 1 oz glass of hard liquor (44 mL).  Lifestyle  Brush your teeth every morning and night with fluoride toothpaste. Floss one time each day.  Exercise for at least  30 minutes 5 or more days each week.  Do not use any products that contain nicotine or tobacco. These products include cigarettes, chewing tobacco, and vaping devices, such as e-cigarettes. If you need help quitting, ask your health care provider.  Do not use drugs.  If you are sexually active, practice safe sex. Use a condom or other form of protection to prevent STIs.  If you do not wish to become pregnant, use a form of birth control. If you plan to become pregnant, see your health care provider for a  prepregnancy visit.  Take aspirin only as told by your health care provider. Make sure that you understand how much to take and what form to take. Work with your health care provider to find out whether it is safe and beneficial for you to take aspirin daily.  Find healthy ways to manage stress, such as:  Meditation, yoga, or listening to music.  Journaling.  Talking to a trusted person.  Spending time with friends and family.  Minimize exposure to UV radiation to reduce your risk of skin cancer.  Safety  Always wear your seat belt while driving or riding in a vehicle.  Do not drive:  If you have been drinking alcohol. Do not ride with someone who has been drinking.  When you are tired or distracted.  While texting.  If you have been using any mind-altering substances or drugs.  Wear a helmet and other protective equipment during sports activities.  If you have firearms in your house, make sure you follow all gun safety procedures.  Seek help if you have been physically or sexually abused.  What's next?  Visit your health care provider once a year for an annual wellness visit.  Ask your health care provider how often you should have your eyes and teeth checked.  Stay up to date on all vaccines.  This information is not intended to replace advice given to you by your health care provider. Make sure you discuss any questions you have with your health care provider.  Document Revised: 02/10/2021 Document Reviewed: 02/10/2021  Elsevier Patient Education  2024 [employer].

## 2024-05-31 NOTE — Progress Notes (Signed)
 [name] [birth year] [id number]   History:  53 y.o. G2P2 presents for annual exam as a new patient. LEEP with Dr Leva at [facility] 9/24 after persistent abnormal paps. Had some irregular bleeding after, treated with silver nitrate. Periods are irregular, would like FSH checked. Last period 6/25.  Gynecologic History Patient's last menstrual period was 02/20/2024 (exact date). Period Cycle (Days):  (skipping periods x's years, hx endometrial ablation) Period Duration (Days): 2 Period Pattern: (!) Irregular Menstrual Flow: Light Menstrual Control: Panty liner Dysmenorrhea: None Contraception/Family planning: tubal ligation Sexually active: yes Last Pap: 7/24 abnormal. LEEP 9/24  Obstetric History OB History  Gravida Para Term Preterm AB Living  2 2    2   SAB IAB Ectopic Multiple Live Births      2    # Outcome Date GA Lbr Len/2nd Weight Sex Type Anes PTL Lv  2 Para           1 Para                05/31/2024    2:20 PM 06/01/2023    9:12 AM 01/09/2023    9:14 AM  Depression screen PHQ 2/9  Decreased Interest 0 0 0  Down, Depressed, Hopeless 0 0 0  PHQ - 2 Score 0 0 0     The following portions of the patient's history were reviewed and updated as appropriate: allergies, current medications, past family history, past medical history, past social history, past surgical history, and problem list.  Review of Systems  All other systems reviewed and are negative.   Past medical history, past surgical history, family history and social history were all reviewed and documented in the EPIC chart.  Exam:  Vitals:   05/31/24 1417  BP: 118/74  Pulse: 67  SpO2: 98%  Weight: 154 lb (69.9 kg)  Height: 5' 7 (1.702 m)   Body mass index is 24.12 kg/m.  Physical Exam Vitals and nursing note reviewed. Exam conducted with a chaperone present.  Constitutional:      Appearance: Normal appearance. She is normal weight.  HENT:     Head: Normocephalic and atraumatic.  Neck:      Thyroid : No thyroid  mass, thyromegaly or thyroid  tenderness.  Cardiovascular:     Rate and Rhythm: Regular rhythm.     Heart sounds: Normal heart sounds.  Pulmonary:     Effort: Pulmonary effort is normal.     Breath sounds: Normal breath sounds.  Chest:  Breasts:    Breasts are symmetrical.     Right: Normal. No inverted nipple, mass, nipple discharge, skin change or tenderness.     Left: Normal. No inverted nipple, mass, nipple discharge, skin change or tenderness.  Abdominal:     General: Abdomen is flat. Bowel sounds are normal.     Palpations: Abdomen is soft.  Genitourinary:    General: Normal vulva.     Vagina: Normal. No vaginal discharge, bleeding or lesions.     Cervix: Cervical bleeding (with pap) present. No discharge or lesion.     Uterus: Normal. Not enlarged and not tender.      Adnexa: Right adnexa normal and left adnexa normal.       Right: No mass, tenderness or fullness.         Left: No mass, tenderness or fullness.    Lymphadenopathy:     Upper Body:     Right upper body: No axillary adenopathy.     Left upper body: No axillary  adenopathy.  Skin:    General: Skin is warm and dry.  Neurological:     Mental Status: She is alert and oriented to person, place, and time.  Psychiatric:        Mood and Affect: Mood normal.        Thought Content: Thought content normal.        Judgment: Judgment normal.      [name], CMA present for exam  Assessment/Plan:   1. Well woman exam with routine gynecological exam (Primary) - Cytology - PAP( [place]) - Labs and cologuard with PCP - Schedule mammogram  2. History of loop electrical excision procedure (LEEP) - Cytology - PAP( [place])  3. Irregular periods - FSH  4. Depression screening     No follow-ups on file.  [name] B [facility]NP-BC 2:38 PM 05/31/2024

## 2024-06-04 ENCOUNTER — Other Ambulatory Visit: Payer: Self-pay

## 2024-06-04 ENCOUNTER — Ambulatory Visit: Payer: Self-pay | Admitting: Radiology

## 2024-06-04 DIAGNOSIS — R87618 Other abnormal cytological findings on specimens from cervix uteri: Secondary | ICD-10-CM

## 2024-06-04 LAB — CYTOLOGY - PAP
Comment: NEGATIVE
Comment: NEGATIVE
Comment: NEGATIVE
Diagnosis: NEGATIVE
HPV 16: NEGATIVE
HPV 18 / 45: NEGATIVE
High risk HPV: POSITIVE — AB

## 2024-06-04 NOTE — Progress Notes (Signed)
 The HPV is the more worrisome part than the abnormal cells. Highly recommend colpo.

## 2024-06-05 ENCOUNTER — Other Ambulatory Visit: Payer: Self-pay | Admitting: Internal Medicine

## 2024-06-05 ENCOUNTER — Other Ambulatory Visit: Payer: Self-pay

## 2024-06-05 NOTE — Telephone Encounter (Signed)
 [email or web address]  The study done in the [place]  was VERY small so the data is not as conclusive. However I am fine repeating in 6 months. If still HPV +will need colpo regardless of result.

## 2024-06-05 NOTE — Telephone Encounter (Signed)
Also see result note 

## 2024-06-13 ENCOUNTER — Encounter: Payer: Self-pay | Admitting: Internal Medicine

## 2024-07-03 ENCOUNTER — Ambulatory Visit (INDEPENDENT_AMBULATORY_CARE_PROVIDER_SITE_OTHER): Payer: Self-pay | Admitting: Internal Medicine

## 2024-07-03 ENCOUNTER — Encounter: Payer: Self-pay | Admitting: Internal Medicine

## 2024-07-03 VITALS — BP 122/76 | HR 65 | Temp 98.2°F | Ht 67.0 in | Wt 152.0 lb

## 2024-07-03 DIAGNOSIS — E7800 Pure hypercholesterolemia, unspecified: Secondary | ICD-10-CM | POA: Diagnosis not present

## 2024-07-03 DIAGNOSIS — Z1211 Encounter for screening for malignant neoplasm of colon: Secondary | ICD-10-CM

## 2024-07-03 DIAGNOSIS — E559 Vitamin D deficiency, unspecified: Secondary | ICD-10-CM

## 2024-07-03 DIAGNOSIS — R739 Hyperglycemia, unspecified: Secondary | ICD-10-CM | POA: Diagnosis not present

## 2024-07-03 DIAGNOSIS — Z0000 Encounter for general adult medical examination without abnormal findings: Secondary | ICD-10-CM | POA: Diagnosis not present

## 2024-07-03 LAB — CBC WITH DIFFERENTIAL/PLATELET
Basophils Absolute: 0.1 K/uL (ref 0.0–0.1)
Basophils Relative: 1.2 % (ref 0.0–3.0)
Eosinophils Absolute: 0.2 K/uL (ref 0.0–0.7)
Eosinophils Relative: 3.9 % (ref 0.0–5.0)
HCT: 36.7 % (ref 36.0–46.0)
Hemoglobin: 12.6 g/dL (ref 12.0–15.0)
Lymphocytes Relative: 39.9 % (ref 12.0–46.0)
Lymphs Abs: 1.7 K/uL (ref 0.7–4.0)
MCHC: 34.3 g/dL (ref 30.0–36.0)
MCV: 87.3 fl (ref 78.0–100.0)
Monocytes Absolute: 0.3 K/uL (ref 0.1–1.0)
Monocytes Relative: 6.1 % (ref 3.0–12.0)
Neutro Abs: 2.1 K/uL (ref 1.4–7.7)
Neutrophils Relative %: 48.9 % (ref 43.0–77.0)
Platelets: 190 K/uL (ref 150.0–400.0)
RBC: 4.2 Mil/uL (ref 3.87–5.11)
RDW: 12.6 % (ref 11.5–15.5)
WBC: 4.3 K/uL (ref 4.0–10.5)

## 2024-07-03 LAB — BASIC METABOLIC PANEL WITH GFR
BUN: 17 mg/dL (ref 6–23)
CO2: 30 meq/L (ref 19–32)
Calcium: 8.7 mg/dL (ref 8.4–10.5)
Chloride: 104 meq/L (ref 96–112)
Creatinine, Ser: 0.85 mg/dL (ref 0.40–1.20)
GFR: 78.45 mL/min (ref >60.00)
Glucose, Bld: 85 mg/dL (ref 70–99)
Potassium: 4.4 meq/L (ref 3.5–5.1)
Sodium: 139 meq/L (ref 135–145)

## 2024-07-03 LAB — LIPID PANEL
Cholesterol: 236 mg/dL — ABNORMAL HIGH (ref 0–200)
HDL: 78.1 mg/dL (ref >39.00)
LDL Cholesterol: 150 mg/dL — ABNORMAL HIGH (ref 0–99)
NonHDL: 157.42
Total CHOL/HDL Ratio: 3
Triglycerides: 38 mg/dL (ref 0.0–149.0)
VLDL: 7.6 mg/dL (ref 0.0–40.0)

## 2024-07-03 LAB — URINALYSIS, ROUTINE W REFLEX MICROSCOPIC
Bilirubin Urine: NEGATIVE
Hgb urine dipstick: NEGATIVE
Ketones, ur: NEGATIVE
Leukocytes,Ua: NEGATIVE
Nitrite: NEGATIVE
Specific Gravity, Urine: 1.01 (ref 1.000–1.030)
Total Protein, Urine: NEGATIVE
Urine Glucose: NEGATIVE
Urobilinogen, UA: 0.2 (ref 0.0–1.0)
pH: 5.5 (ref 5.0–8.0)

## 2024-07-03 LAB — HEPATIC FUNCTION PANEL
ALT: 8 U/L (ref 0–35)
AST: 14 U/L (ref 0–37)
Albumin: 4.2 g/dL (ref 3.5–5.2)
Alkaline Phosphatase: 39 U/L (ref 39–117)
Bilirubin, Direct: 0 mg/dL (ref 0.0–0.3)
Total Bilirubin: 0.4 mg/dL (ref 0.2–1.2)
Total Protein: 6.8 g/dL (ref 6.0–8.3)

## 2024-07-03 LAB — HEMOGLOBIN A1C: Hgb A1c MFr Bld: 5.1 % (ref 4.6–6.5)

## 2024-07-03 MED ORDER — CITALOPRAM HYDROBROMIDE 10 MG PO TABS: 10.0000 mg | ORAL_TABLET | Freq: Every day | ORAL | 3 refills | Status: AC

## 2024-07-03 NOTE — Assessment & Plan Note (Signed)
 Lab Results  Component Value Date   LDLCALC 138 (H) 06/01/2023   uncontrolled, pt to continue current statin crestor  5 mg and lower chol diet, and f/u lab today

## 2024-07-03 NOTE — Progress Notes (Signed)
 Patient ID: [name], female   DOB: 1970/11/07, 53 y.o.   MRN: [id number]         Chief Complaint:: wellness exam       HPI:  [name] is a 53 y.o. female here for wellness exam; pt to call for mammogram soon, declines all vaccinations for now, due for repeat cologuard, o/w up to date                        Also has been tx for plantar fasciits with recent move of mother to independent living; s\/p cervical leep 2024, HPV pos.      Wt Readings from Last 3 Encounters:  07/03/24 152 lb (68.9 kg)  05/31/24 154 lb (69.9 kg)  06/01/23 148 lb (67.1 kg)   BP Readings from Last 3 Encounters:  07/03/24 122/76  05/31/24 118/74  06/01/23 120/72   Immunization History  Administered Date(s) Administered   Td 01/29/2007   Td (Adult),5 Lf Tetanus Toxid, Preservative Free 01/29/2007   Tdap 12/01/2017   Health Maintenance Due  Topic Date Due   Hepatitis B Vaccines 19-59 Average Risk (1 of 3 - 19+ 3-dose series) Never done   Colonoscopy  Never done   Pneumococcal Vaccine: 50+ Years (1 of 1 - PCV) Never done   Zoster Vaccines- Shingrix (1 of 2) Never done   Mammogram  03/09/2024      Past Medical History:  Diagnosis Date   Abnormal Pap smear of cervix    ALLERGIC RHINITIS 10/25/2007   ANXIETY 10/05/2009   ANXIETY DEPRESSION 10/25/2007   DEPRESSION 10/05/2009   GERD (gastroesophageal reflux disease)    occasional-tums prn   MIGRAINE HEADACHE 10/25/2007   Past Surgical History:  Procedure Laterality Date   IUD REMOVAL  07/29/2011   Procedure: INTRAUTERINE DEVICE (IUD) REMOVAL;  Surgeon: Norleen GORMAN Skill;  Location: [facility];  Service: Gynecology;  Laterality: N/A;   LAPAROSCOPIC TUBAL LIGATION  07/29/2011   Procedure: LAPAROSCOPIC TUBAL LIGATION;  Surgeon: Norleen GORMAN Skill;  Location: [facility];  Service: Gynecology;  Laterality: Bilateral;   LEEP     04/2023   TUBAL LIGATION      reports that she has never smoked. She has been exposed to tobacco smoke. She has never used smokeless  tobacco. She reports current alcohol use. She reports that she does not use drugs. family history includes Alzheimer's disease in her mother; CAD in her father, maternal grandfather, and mother; COPD in her father; Cancer in her maternal grandfather; Heart failure in her father; Hyperlipidemia in her mother; Hypertension in her father and another family member; Lung disease in her paternal grandfather; Other in her maternal grandmother. Allergies  Allergen Reactions   Penicillins     Childhood reaction - rash    Current Outpatient Medications on File Prior to Visit  Medication Sig Dispense Refill   Ascorbic Acid (VITAMIN C) 1000 MG tablet Take 1,000 mg by mouth daily. (Patient not taking: Reported on 05/31/2024)     B Complex-C (B-COMPLEX WITH VITAMIN C) tablet Take 1 tablet by mouth daily. (Patient not taking: Reported on 05/31/2024)     BERBERINE CHLORIDE PO Take by mouth.     diclofenac  (VOLTAREN ) 75 MG EC tablet Take 1 tablet (75 mg total) by mouth 2 (two) times daily. (Patient not taking: Reported on 05/31/2024) 50 tablet 2   fluconazole (DIFLUCAN) 150 MG tablet Take 1 tablet by oral route for 1 day. (Patient not taking: Reported  on 05/31/2024)     MAGNESIUM PO Take by mouth.     metroNIDAZOLE (METROGEL) 0.75 % vaginal gel Place vaginally. (Patient not taking: Reported on 05/31/2024)     nitrofurantoin  (MACRODANTIN ) 50 MG capsule Take 1 capsule (50 mg total) by mouth 2 (two) times daily. (Patient not taking: Reported on 05/31/2024) 20 capsule 0   OVER THE COUNTER MEDICATION Take 1 capsule by mouth daily in the afternoon. Neuro Magnesium (Patient not taking: Reported on 05/31/2024)     rosuvastatin  (CRESTOR ) 5 MG tablet Take 1 tablet (5 mg total) by mouth daily. (Patient not taking: Reported on 07/03/2024) 90 tablet 3   triamcinolone  cream (KENALOG ) 0.1 % Apply 1 application topically 2 (two) times daily. (Patient not taking: Reported on 07/03/2024) 30 g 0   valACYclovir  (VALTREX ) 1000 MG tablet Take  1 tablet (1,000 mg total) by mouth 2 (two) times daily. 90 tablet 2   Vitamin D -Vitamin K (VITAMIN K2-VITAMIN D3 PO) Take 1 capsule by mouth daily in the afternoon.     zolpidem  (AMBIEN ) 10 MG tablet TAKE 1 TABLET BY MOUTH EVERY DAY AT BEDTIME AS NEEDED FOR SLEEP 90 tablet 1   No current facility-administered medications on file prior to visit.        ROS:  All others reviewed and negative.  Objective        PE:  BP 122/76 (BP Location: Left Arm, Patient Position: Sitting, Cuff Size: Normal)   Pulse 65   Temp 98.2 F (36.8 C) (Oral)   Ht 5' 7 (1.702 m)   Wt 152 lb (68.9 kg)   LMP  (LMP Unknown)   SpO2 98%   BMI 23.81 kg/m                 Constitutional: Pt appears in NAD               HENT: Head: NCAT.                Right Ear: External ear normal.                 Left Ear: External ear normal.                Eyes: . Pupils are equal, round, and reactive to light. Conjunctivae and EOM are normal               Nose: without d/c or deformity               Neck: Neck supple. Gross normal ROM               Cardiovascular: Normal rate and regular rhythm.                 Pulmonary/Chest: Effort normal and breath sounds without rales or wheezing.                Abd:  Soft, NT, ND, + BS, no organomegaly               Neurological: Pt is alert. At baseline orientation, motor grossly intact               Skin: Skin is warm. No rashes, no other new lesions, LE edema - none               Psychiatric: Pt behavior is normal without agitation   Micro: none  Cardiac tracings I have personally interpreted today:  none  Pertinent Radiological findings (summarize): none  Lab Results  Component Value Date   WBC 6.6 06/01/2023   HGB 13.6 06/01/2023   HCT 40.4 06/01/2023   PLT 237.0 06/01/2023   GLUCOSE 96 06/01/2023   CHOL 243 (H) 06/01/2023   TRIG 48.0 06/01/2023   HDL 94.70 06/01/2023   LDLDIRECT 101.3 03/08/2012   LDLCALC 138 (H) 06/01/2023   ALT 10 06/01/2023   AST 16  06/01/2023   NA 137 06/01/2023   K 4.5 06/01/2023   CL 102 06/01/2023   CREATININE 0.87 06/01/2023   BUN 20 06/01/2023   CO2 29 06/01/2023   TSH 0.74 06/01/2023   HGBA1C 5.3 06/01/2023   Assessment/Plan:  [name] is a 53 y.o. White or Caucasian [1] female with  has a past medical history of Abnormal Pap smear of cervix, ALLERGIC RHINITIS (10/25/2007), ANXIETY (10/05/2009), ANXIETY DEPRESSION (10/25/2007), DEPRESSION (10/05/2009), GERD (gastroesophageal reflux disease), and MIGRAINE HEADACHE (10/25/2007).  Preventative health care Age and sex appropriate education and counseling updated with regular exercise and diet Referrals for preventative services - none needed Immunizations addressed - declines all today for now Smoking counseling  - none needed Evidence for depression or other mood disorder - stable anxiety  depression - continue celexa  Most recent labs reviewed. I have personally reviewed and have noted: 1) the patient's medical and social history 2) The patient's current medications and supplements 3) The patient's height, weight, and BMI have been recorded in the chart   HYPERCHOLESTEROLEMIA Lab Results  Component Value Date   LDLCALC 138 (H) 06/01/2023   uncontrolled, pt to continue current statin crestor  5 mg and lower chol diet, and f/u lab today  Followup: Return in about 1 year (around 07/03/2025).  Lynwood Rush, MD 07/03/2024 6:07 PM [facility]

## 2024-07-03 NOTE — Assessment & Plan Note (Addendum)
 Age and sex appropriate education and counseling updated with regular exercise and diet Referrals for preventative services - none needed Immunizations addressed - declines all today for now Smoking counseling  - none needed Evidence for depression or other mood disorder - stable anxiety  depression - continue celexa  Most recent labs reviewed. I have personally reviewed and have noted: 1) the patient's medical and social history 2) The patient's current medications and supplements 3) The patient's height, weight, and BMI have been recorded in the chart

## 2024-07-03 NOTE — Patient Instructions (Signed)
Please continue all other medications as before, and refills have been done if requested.  Please have the pharmacy call with any other refills you may need.  Please continue your efforts at being more active, low cholesterol diet, and weight control.  You are otherwise up to date with prevention measures today.  Please keep your appointments with your specialists as you may have planned  You will be contacted regarding the referral for: cologuard  Please go to the LAB at the blood drawing area for the tests to be done  You will be contacted by phone if any changes need to be made immediately.  Otherwise, you will receive a letter about your results with an explanation, but please check with MyChart first.  Please make an Appointment to return for your 1 year visit, or sooner if needed

## 2024-07-04 ENCOUNTER — Ambulatory Visit: Payer: Self-pay | Admitting: Internal Medicine

## 2024-07-04 ENCOUNTER — Other Ambulatory Visit: Payer: Self-pay | Admitting: Internal Medicine

## 2024-07-04 LAB — VITAMIN D 25 HYDROXY (VIT D DEFICIENCY, FRACTURES): VITD: 51.83 ng/mL (ref 30.00–100.00)

## 2024-07-04 LAB — TSH: TSH: 0.72 u[IU]/mL (ref 0.35–5.50)

## 2024-07-04 MED ORDER — ROSUVASTATIN CALCIUM 10 MG PO TABS
10.0000 mg | ORAL_TABLET | Freq: Every day | ORAL | 3 refills | Status: AC
Start: 2024-07-04 — End: ?

## 2024-08-13 ENCOUNTER — Other Ambulatory Visit: Payer: Self-pay | Admitting: Internal Medicine

## 2024-08-13 ENCOUNTER — Other Ambulatory Visit: Payer: Self-pay

## 2024-08-14 LAB — COLOGUARD: COLOGUARD: NEGATIVE

## 2024-10-04 ENCOUNTER — Telehealth: Payer: Self-pay | Admitting: Internal Medicine

## 2024-10-04 NOTE — Telephone Encounter (Signed)
 Requesting TOC from John to [facility]. Please advise

## 2024-10-31 ENCOUNTER — Encounter: Admitting: Family Medicine

## 2024-12-25 ENCOUNTER — Ambulatory Visit: Admitting: Dermatology
